# Patient Record
Sex: Female | Born: 1990
Health system: Southern US, Community
[De-identification: ages and names within clinical notes are randomized; demographics above are authoritative.]

## PROBLEM LIST (undated history)

## (undated) DIAGNOSIS — A749 Chlamydial infection, unspecified: Secondary | ICD-10-CM

## (undated) DIAGNOSIS — E559 Vitamin D deficiency, unspecified: Secondary | ICD-10-CM

## (undated) DIAGNOSIS — F909 Attention-deficit hyperactivity disorder, unspecified type: Secondary | ICD-10-CM

## (undated) HISTORY — DX: Chlamydial infection, unspecified: A74.9

## (undated) HISTORY — DX: Vitamin D deficiency, unspecified: E55.9

## (undated) HISTORY — DX: Attention-deficit hyperactivity disorder, unspecified type: F90.9

---
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
## 2009-03-15 ENCOUNTER — Ambulatory Visit: Payer: Self-pay | Admitting: Family Medicine

## 2010-01-18 ENCOUNTER — Ambulatory Visit: Payer: Self-pay | Admitting: Family Medicine

## 2010-07-25 ENCOUNTER — Inpatient Hospital Stay (HOSPITAL_COMMUNITY)
Admission: AD | Admit: 2010-07-25 | Discharge: 2010-07-25 | Disposition: A | Discharge status: Home / Self Care | Payer: BC Managed Care – PPO | Source: Ambulatory Visit | Attending: Obstetrics and Gynecology | Admitting: Obstetrics and Gynecology

## 2010-07-25 DIAGNOSIS — A5400 Gonococcal infection of lower genitourinary tract, unspecified: Secondary | ICD-10-CM | POA: Insufficient documentation

## 2010-07-25 DIAGNOSIS — N739 Female pelvic inflammatory disease, unspecified: Secondary | ICD-10-CM | POA: Insufficient documentation

## 2010-07-25 DIAGNOSIS — A5619 Other chlamydial genitourinary infection: Secondary | ICD-10-CM | POA: Insufficient documentation

## 2010-09-12 ENCOUNTER — Encounter: Payer: Self-pay | Admitting: Family Medicine

## 2011-01-02 ENCOUNTER — Encounter: Payer: BC Managed Care – PPO | Admitting: Family Medicine

## 2011-01-20 ENCOUNTER — Encounter: Payer: Self-pay | Admitting: Family Medicine

## 2011-01-20 ENCOUNTER — Ambulatory Visit (INDEPENDENT_AMBULATORY_CARE_PROVIDER_SITE_OTHER): Payer: BC Managed Care – PPO | Admitting: Family Medicine

## 2011-01-20 DIAGNOSIS — Z23 Encounter for immunization: Secondary | ICD-10-CM

## 2011-01-20 DIAGNOSIS — Z0000 Encounter for general adult medical examination without abnormal findings: Secondary | ICD-10-CM

## 2011-01-20 DIAGNOSIS — Z13220 Encounter for screening for lipoid disorders: Secondary | ICD-10-CM

## 2011-01-20 DIAGNOSIS — Z202 Contact with and (suspected) exposure to infections with a predominantly sexual mode of transmission: Secondary | ICD-10-CM

## 2011-01-20 LAB — POCT URINALYSIS DIPSTICK
Bilirubin, UA: NEGATIVE
Blood, UA: 5
Glucose, UA: NEGATIVE
Leukocytes, UA: NEGATIVE
Nitrite, UA: NEGATIVE
Urobilinogen, UA: NEGATIVE

## 2011-01-20 LAB — LIPID PANEL
Cholesterol: 188 mg/dL (ref 0–200)
Total CHOL/HDL Ratio: 3.8 Ratio

## 2011-01-20 LAB — HIV ANTIBODY (ROUTINE TESTING W REFLEX): HIV: NONREACTIVE

## 2011-01-20 LAB — RPR

## 2011-01-20 NOTE — Patient Instructions (Addendum)
HEALTH MAINTENANCE RECOMMENDATIONS:  It is recommended that you get at least 30 minutes of aerobic exercise at least 5 days/week (for weight loss, you may need as much as 60-90 minutes). This can be any activity that gets your heart rate up. This can be divided in 10-15 minute intervals if needed, but try and build up your endurance at least once a week.  Weight bearing exercise is also recommended twice weekly.  Eat a healthy diet with lots of vegetables, fruits and fiber.  "Colorful" foods have a lot of vitamins (ie green vegetables, tomatoes, red peppers, etc).  Limit sweet tea, regular sodas and alcoholic beverages, all of which has a lot of calories and sugar.  Up to 1 alcoholic drink daily may be beneficial for women (unless trying to lose weight, watch sugars).  Drink a lot of water.  Calcium recommendations are 1200-1500 mg daily (1500 mg for postmenopausal women or women without ovaries), and vitamin D 1000 IU daily.  This should be obtained from diet and/or supplements (vitamins), and calcium should not be taken all at once, but in divided doses.  Monthly self breast exams and yearly mammograms for women over the age of 21 is recommended.  Sunscreen of at least SPF 30 should be used on all sun-exposed parts of the skin when outside between the hours of 10 am and 4 pm (not just when at beach or pool, but even with exercise, golf, tennis, and yard work!)  Use a sunscreen that says "broad spectrum" so it covers both UVA and UVB rays, and make sure to reapply every 1-2 hours.  Remember to change the batteries in your smoke detectors when changing your clock times in the spring and fall.  Use your seat belt every time you are in a car, and please drive safely and not be distracted with cell phones and texting while driving.   Return in 2 months and 6 months for the 2nd and third gardisil (HPV) vaccines

## 2011-01-20 NOTE — Progress Notes (Signed)
Yvonne [name] is a 20 y.o. female who presents for a complete physical.  She has the following concerns: Occasionally has some spotting after intercourse--this usually occurs just after her cycle has ended, lasting only 1 day; then no further bleeding after intercourse the rest of the month.  She had gonorrhea in March.  Also states she had syphillis then--on further questioning, it sounds more like she had chlamydia (treated with a shot and 2 pills for treating both; didn't have any bloodwork taken).  Had test of cure also done by GYN.  Immunization History  Administered Date(s) Administered  . DTaP 12/18/1989, 03/05/1990, 05/14/1990, 02/03/1991  . Hepatitis B 01/31/2003, 03/13/2003, 08/07/2003  . HiB 12/18/1989, 03/05/1990, 05/14/1990, 02/03/1991  . IPV 12/18/1989, 03/05/1990, 02/03/1991  . Influenza Split 01/20/2011  . Influenza Whole 02/17/2007, 01/19/2008, 03/15/2009  . MMR 02/03/1991  Doesn't recall if/when she had TdaP (no record in chart) Last Pap smear: 06/2010 Last mammogram: never Last colonoscopy: never Last DEXA: never Dentist: twice yearly Ophtho: wears contacts, sees once yearly Exercise: 30 minutes daily  Past Medical History  Diagnosis Date  . ADHD (attention deficit hyperactivity disorder)     off meds since 2011    History reviewed. No pertinent past surgical history.  History   Social History  . Marital Status: Single    Spouse Name: N/A    Number of Children: N/A  . Years of Education: N/A   Occupational History  . student    Social History Main Topics  . Smoking status: Never Smoker   . Smokeless tobacco: Never Used  . Alcohol Use: Yes     1-2 drinks per weekend.  . Drug Use: No  . Sexually Active: Not Currently    Birth Control/ Protection: OCP   Other Topics Concern  . 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 Lives with her parents and brother    Family History  Problem Relation  Age of Onset  . Hypertension Mother   . Diabetes Father   . Hypertension Father     Current outpatient prescriptions:Norgestimate-Ethinyl Estradiol Triphasic (TRI-PREVIFEM) 0.18/0.215/0.25 MG-35 MCG tablet, Take 1 tablet by mouth daily.  , Disp: , Rfl:   No Known Allergies  ROS: The patient denies anorexia, fever, weight changes, headaches,  vision changes, decreased hearing, ear pain, sore throat, breast concerns, chest pain, palpitations, dizziness, syncope, dyspnea on exertion, cough, swelling, nausea, vomiting, diarrhea, constipation, abdominal pain, melena, hematochezia, indigestion/heartburn, hematuria, incontinence, dysuria, irregular menstrual cycles, vaginal discharge, odor or itch, genital lesions, joint pains, numbness, tingling, weakness, tremor, suspicious skin lesions, depression, anxiety, abnormal bleeding/bruising, or enlarged lymph nodes. +occasionally has some wheezing when she sleeps (wakes her up, rarely)--wakes up, sits up, and it completely resolves. Able to immediately lie back down and go to sleep with no problems  PHYSICAL EXAM: BP 102/70  Pulse 80  Ht 5' (1.524 m)  Wt 121 lb (54.885 kg)  BMI 23.63 kg/m2  LMP 01/15/2011  General Appearance:    Alert, cooperative, no distress, appears stated age  Head:    Normocephalic, without obvious abnormality, atraumatic  Eyes:    PERRL, conjunctiva/corneas clear, EOM's intact, fundi    benign  Ears:    Normal TM's and external ear canals  Nose:   Nares normal, mucosa normal, no drainage or sinus   tenderness  Throat:   Lips, mucosa, and tongue normal; teeth and gums normal  Neck:   Supple, no lymphadenopathy;  thyroid:  no   enlargement/tenderness/nodules; no carotid   bruit or JVD  Back:    Spine nontender, no curvature, ROM normal, no CVA     tenderness  Lungs:     Clear to auscultation bilaterally without wheezes, rales or     ronchi; respirations unlabored  Chest Wall:    No tenderness or deformity   Heart:     Regular rate and rhythm, S1 and S2 normal, no murmur, rub   or gallop  Breast Exam:    Deferred to GYN  Abdomen:     Soft, non-tender, nondistended, normoactive bowel sounds,    no masses, no hepatosplenomegaly  Genitalia:    Deferred to GYN     Extremities:   No clubbing, cyanosis or edema  Pulses:   2+ and symmetric all extremities  Skin:   Skin color, texture, turgor normal, no rashes or lesions.  Hypopigmented macule upper back (round), and mid back--a littlemore irregular, consistent with birthmark  Lymph nodes:   Cervical, supraclavicular, and axillary nodes normal  Neurologic:   CNII-XII intact, normal strength, sensation and gait; reflexes 2+ and symmetric throughout          Psych:   Normal mood, affect, hygiene and grooming.    ASSESSMENT/PLAN:  1. Routine general medical examination at a health care facility  Visual acuity screening, POCT Urinalysis Dipstick  2. Need for prophylactic vaccination and inoculation against influenza  Flu vaccine greater than or equal to 3yo preservative free IM  3. Exposure to STD  HIV Antibody ( Reflex), RPR  4. Screening for lipoid disorders  Lipid panel  5. Need for Tdap vaccination  Tdap vaccine greater than or equal to 7yo IM  6. Need for HPV vaccination  HPV vaccine quadrivalent 3 dose IM   H/o STD's in March.  Never had HIV or RPR.   Check today  Continue self breast exams; at least 30 minutes of aerobic activity at least 5 days/week; proper sunscreen use reviewed; healthy diet, including goals of calcium and vitamin D intake and alcohol recommendations (less than or equal to 1 drink/day) reviewed; regular seatbelt use; changing batteries in smoke detectors.  Immunization recommendations discussed--updated today.

## 2011-01-21 ENCOUNTER — Telehealth: Payer: Self-pay | Admitting: *Deleted

## 2011-01-21 NOTE — Telephone Encounter (Signed)
Left message for patient to return my call to go over lab results. 

## 2011-01-27 ENCOUNTER — Telehealth: Payer: Self-pay | Admitting: *Deleted

## 2011-01-27 NOTE — Telephone Encounter (Signed)
Spoke with patient and informed her that her lipids were good, no HIV or syphilis.

## 2011-03-24 ENCOUNTER — Other Ambulatory Visit (INDEPENDENT_AMBULATORY_CARE_PROVIDER_SITE_OTHER): Payer: BC Managed Care – PPO

## 2011-03-24 DIAGNOSIS — Z23 Encounter for immunization: Secondary | ICD-10-CM

## 2011-06-27 LAB — HM PAP SMEAR

## 2011-07-21 ENCOUNTER — Other Ambulatory Visit: Payer: BC Managed Care – PPO

## 2011-07-22 ENCOUNTER — Other Ambulatory Visit: Payer: BC Managed Care – PPO

## 2011-07-28 ENCOUNTER — Other Ambulatory Visit (INDEPENDENT_AMBULATORY_CARE_PROVIDER_SITE_OTHER): Payer: 59

## 2011-07-28 DIAGNOSIS — Z23 Encounter for immunization: Secondary | ICD-10-CM

## 2012-02-27 DIAGNOSIS — A749 Chlamydial infection, unspecified: Secondary | ICD-10-CM

## 2012-02-27 HISTORY — DX: Chlamydial infection, unspecified: A74.9

## 2012-03-29 ENCOUNTER — Ambulatory Visit (INDEPENDENT_AMBULATORY_CARE_PROVIDER_SITE_OTHER): Payer: 59 | Admitting: Family Medicine

## 2012-03-29 ENCOUNTER — Encounter: Payer: Self-pay | Admitting: Family Medicine

## 2012-03-29 VITALS — BP 110/64 | HR 72 | Ht 61.0 in | Wt 118.0 lb

## 2012-03-29 DIAGNOSIS — Z111 Encounter for screening for respiratory tuberculosis: Secondary | ICD-10-CM

## 2012-03-29 DIAGNOSIS — Z0000 Encounter for general adult medical examination without abnormal findings: Secondary | ICD-10-CM

## 2012-03-29 DIAGNOSIS — Z23 Encounter for immunization: Secondary | ICD-10-CM

## 2012-03-29 LAB — POCT URINALYSIS DIPSTICK
Bilirubin, UA: NEGATIVE
Blood, UA: NEGATIVE
Nitrite, UA: NEGATIVE
Protein, UA: NEGATIVE
pH, UA: 5

## 2012-03-29 NOTE — Progress Notes (Signed)
Chief Complaint  Patient presents with  . Annual Exam    nonfasting annual exam no pap-sees Dr. Ambrose Mantle and is up to date. Would like a flu shot today. Thinks maybe she has a yeast infection, has white discharge, itch and odor. Also would like to know if douching is okay.   Yvonne [name] is a 21 y.o. female who presents for a complete physical.  She has the following concerns:  Needs PPD for school  Complaining of vaginal discharge--it is clearish-white, sometimes is just white.  Only occasionally has itching.  There is a slight odor to her underwear, and she isn't really sure if it is the discharge that has the odor, versus just her private parts, related to sweating.  She reports having a lot of sweating in her private area, and underwear is often moist from perspiration (not just from discharge).  She wears both cotton and nylon underwear.  Denies there being any change to the discharge, has remained the same x months.  [facility] and HIV test.  She had chlamydia and was treated for it.  She is scheduled to follow up with them for recheck (3 month f/u, per pt).  She was also treated for STD last year by her GYN.  She is not currently in a sexual relationship (since she broke up with boyfriend after learning of his infidelity).  Immunization History  Administered Date(s) Administered  . DTaP 12/18/1989, 03/05/1990, 05/14/1990, 02/03/1991  . HPV Quadrivalent 01/20/2011, 03/24/2011, 07/28/2011  . Hepatitis B 01/31/2003, 03/13/2003, 08/07/2003  . HiB 12/18/1989, 03/05/1990, 05/14/1990, 02/03/1991  . IPV 12/18/1989, 03/05/1990, 02/03/1991  . Influenza Split 01/20/2011  . Influenza Whole 02/17/2007, 01/19/2008, 03/15/2009  . MMR 02/03/1991  . Tdap 01/20/2011   Last Pap smear: March, 2013 with Dr. Ambrose Mantle Last mammogram: n/a Last colonoscopy:n/a Last DEXA: n/a Dentist: twice yearly Ophtho: yearly, wears contacts Exercise: only about 10  mins/day--walking to a friend's house, or to the store.  Past Medical History  Diagnosis Date  . ADHD (attention deficit hyperactivity disorder)     off meds since 2011  . Chlamydia 02/2012    treated at [facility]    History reviewed. No pertinent past surgical history.  History   Social History  . Marital Status: Single    Spouse Name: N/A    Number of Children: N/A  . Years of Education: N/A   Occupational History  . Not on file.   Social History Main Topics  . Smoking status: Never Smoker   . Smokeless tobacco: Never Used  . Alcohol Use: Yes     Comment: 1-2 drinks per weekend.  . Drug Use: No  . Sexually Active: Not Currently -- Female partner(s)    Birth Control/ Protection: OCP   Other Topics Concern  . 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 Lives with her parents and brother and is looking for a job.    Family History  Problem Relation Age of Onset  . Hypertension Mother   . Diabetes Father   . Hypertension Father   . Cancer Paternal Aunt     breast cancer    Current outpatient prescriptions:Norgestimate-Ethinyl Estradiol Triphasic (TRI-PREVIFEM) 0.18/0.215/0.25 MG-35 MCG tablet, Take 1 tablet by mouth daily.  , Disp: , Rfl:   No Known Allergies  ROS: The patient denies anorexia, fever, weight changes,  headaches, vision changes, decreased hearing, ear pain, sore throat, breast concerns, chest pain, palpitations, dizziness, syncope, dyspnea on exertion, cough, swelling, nausea, vomiting, diarrhea, constipation, abdominal pain, melena, hematochezia, indigestion/heartburn, hematuria, incontinence, dysuria, irregular menstrual cycles, genital lesions, joint pains, numbness, tingling, weakness, tremor, suspicious skin lesions, depression, anxiety, abnormal bleeding/bruising, or enlarged lymph nodes.  +vaginal discharge/odor as per HPI  PHYSICAL  EXAM: BP 110/64  Pulse 72  Ht 5\' 1"  (1.549 m)  Wt 118 lb (53.524 kg)  BMI 22.30 kg/m2  LMP 03/03/2012  General Appearance:  Alert, cooperative, no distress, appears stated age   Head:  Normocephalic, without obvious abnormality, atraumatic   Eyes:  PERRL, conjunctiva/corneas clear, EOM's intact, fundi  benign   Ears:  Normal TM's and external ear canals   Nose:  Nares normal, mucosa normal, no drainage or sinus tenderness   Throat:  Lips, mucosa, and tongue normal; teeth and gums normal   Neck:  Supple, no lymphadenopathy; thyroid: no enlargement/tenderness/nodules; no carotid  bruit or JVD   Back:  Spine nontender, no curvature, ROM normal, no CVA tenderness   Lungs:  Clear to auscultation bilaterally without wheezes, rales or ronchi; respirations unlabored   Chest Wall:  No tenderness or deformity   Heart:  Regular rate and rhythm, S1 and S2 normal, no murmur, rub  or gallop   Breast Exam:  Deferred to GYN   Abdomen:  Soft, non-tender, nondistended, normoactive bowel sounds,  no masses, no hepatosplenomegaly   Genitalia:  Deferred to GYN      Extremities:  No clubbing, cyanosis or edema   Pulses:  2+ and symmetric all extremities   Skin:  Skin color, texture, turgor normal, no rashes or lesions. Hypopigmented macule upper back (round), and mid back--a littlemore irregular, consistent with birthmark   Lymph nodes:  Cervical, supraclavicular, and axillary nodes normal   Neurologic:  CNII-XII intact, normal strength, sensation and gait; reflexes 2+ and symmetric throughout   Psych: Normal mood, affect, hygiene and grooming.   ASSESSMENT/PLAN: 1. Routine general medical examination at a health care facility  Visual acuity screening, POCT Urinalysis Dipstick  2. Need for prophylactic vaccination and inoculation against influenza  Flu vaccine greater than or equal to 3yo preservative free IM  3. Screening examination for pulmonary tuberculosis  TB Skin Test   ?vaginal discharge,  odor. Sounds as though discharge is physiologic, unchanged.  Odor sounds more like body odor, rather than an odorous discharge.  Explained at length, all questions answered. Use cotton underwear, consider powder to help with moisture, and consider pantiliners, that can be changed out for dry ones more easily than changing underwear.  History doesn't sound consistent with either BV or yeast infection at this time, but some normal discharge, and some body odor in vaginal area related to perspiration (not necessary an odorous vaginal discharge).  SAFE SEX was discussed at length.  She has had two STD's in the last year or so, and 9 sexual partners.  Discussed at length.  Encouraged regular condom use in addition to the OCP's she takes.  Discussed monthly self breast exam; at least 30 minutes of aerobic activity at least 5 days/week; proper sunscreen use reviewed; healthy diet, including goals of calcium and vitamin D intake and alcohol recommendations (less than or equal to 1 drink/day) reviewed; regular seatbelt use; changing batteries in smoke detectors.  Immunization recommendations discussed--flu shot today  Return for PPD reading in 2-3 days. Lipids normal last year Had recent HIV test through [facility], not repeated  today. F/u with [facility] as scheduled for TOC

## 2012-03-29 NOTE — Patient Instructions (Signed)
HEALTH MAINTENANCE RECOMMENDATIONS:  It is recommended that you get at least 30 minutes of aerobic exercise at least 5 days/week (for weight loss, you may need as much as 60-90 minutes). This can be any activity that gets your heart rate up. This can be divided in 10-15 minute intervals if needed, but try and build up your endurance at least once a week.  Weight bearing exercise is also recommended twice weekly.  Eat a healthy diet with lots of vegetables, fruits and fiber.  "Colorful" foods have a lot of vitamins (ie green vegetables, tomatoes, red peppers, etc).  Limit sweet tea, regular sodas and alcoholic beverages, all of which has a lot of calories and sugar.  Up to 1 alcoholic drink daily may be beneficial for women (unless trying to lose weight, watch sugars).  Drink a lot of water.  Calcium recommendations are 1200-1500 mg daily (1500 mg for postmenopausal women or women without ovaries), and vitamin D 1000 IU daily.  This should be obtained from diet and/or supplements (vitamins), and calcium should not be taken all at once, but in divided doses.  Monthly self breast exams and yearly mammograms for women over the age of 80 is recommended.  Sunscreen of at least SPF 30 should be used on all sun-exposed parts of the skin when outside between the hours of 10 am and 4 pm (not just when at beach or pool, but even with exercise, golf, tennis, and yard work!)  Use a sunscreen that says "broad spectrum" so it covers both UVA and UVB rays, and make sure to reapply every 1-2 hours.  Remember to change the batteries in your smoke detectors when changing your clock times in the spring and fall.  Use your seat belt every time you are in a car, and please drive safely and not be distracted with cell phones and texting while driving.  Please use condoms regularly to avoid getting sexually transmitted diseases.  Birth control pills only protect against pregnancy, not these other infections.  [facility] for [facility] reche[facility] to ensure that infection is completely gone.

## 2012-03-31 LAB — TB SKIN TEST

## 2012-09-06 ENCOUNTER — Encounter: Payer: Self-pay | Admitting: Family Medicine

## 2015-01-30 ENCOUNTER — Other Ambulatory Visit (INDEPENDENT_AMBULATORY_CARE_PROVIDER_SITE_OTHER): Payer: 59

## 2015-01-30 DIAGNOSIS — Z23 Encounter for immunization: Secondary | ICD-10-CM

## 2015-04-02 ENCOUNTER — Telehealth: Payer: Self-pay | Admitting: *Deleted

## 2015-04-02 ENCOUNTER — Encounter: Payer: 59 | Admitting: Family Medicine

## 2015-04-02 DIAGNOSIS — Z0000 Encounter for general adult medical examination without abnormal findings: Secondary | ICD-10-CM

## 2015-04-02 NOTE — Telephone Encounter (Signed)
Hasn't been seen in 3 years, this was for physical. She should get letter and be charged no show fee.

## 2015-04-02 NOTE — Telephone Encounter (Signed)
This patient no showed for their appointment today.Which of the following is necessary for this patient.   A) No follow-up necessary   B) Follow-up urgent. Locate Patient Immediately.   C) Follow-up necessary. Contact patient and Schedule visit in ____ Days.   D) Follow-up Advised. Contact patient and Schedule visit in ____ Days. 

## 2015-04-06 ENCOUNTER — Encounter: Payer: Self-pay | Admitting: Family Medicine

## 2015-04-06 NOTE — Telephone Encounter (Signed)
No show letter with fee sent °

## 2015-06-28 ENCOUNTER — Encounter: Payer: Self-pay | Admitting: Family Medicine

## 2015-06-28 ENCOUNTER — Ambulatory Visit (INDEPENDENT_AMBULATORY_CARE_PROVIDER_SITE_OTHER): Payer: 59 | Admitting: Family Medicine

## 2015-06-28 VITALS — BP 108/62 | HR 68 | Ht 60.5 in | Wt 128.0 lb

## 2015-06-28 DIAGNOSIS — R21 Rash and other nonspecific skin eruption: Secondary | ICD-10-CM

## 2015-06-28 DIAGNOSIS — Z0000 Encounter for general adult medical examination without abnormal findings: Secondary | ICD-10-CM

## 2015-06-28 DIAGNOSIS — R5383 Other fatigue: Secondary | ICD-10-CM | POA: Diagnosis not present

## 2015-06-28 LAB — HIV ANTIBODY (ROUTINE TESTING W REFLEX): HIV: NONREACTIVE

## 2015-06-28 LAB — POCT URINALYSIS DIPSTICK
Bilirubin, UA: NEGATIVE
Glucose, UA: NEGATIVE
Ketones, UA: NEGATIVE
LEUKOCYTES UA: NEGATIVE
NITRITE UA: NEGATIVE
PH UA: 6
PROTEIN UA: NEGATIVE
Spec Grav, UA: 1.02
UROBILINOGEN UA: NEGATIVE
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
## 2015-06-28 NOTE — Patient Instructions (Addendum)
  HEALTH MAINTENANCE RECOMMENDATIONS:  It is recommended that you get at least 30 minutes of aerobic exercise at least 5 days/week (for weight loss, you may need as much as 60-90 minutes). This can be any activity that gets your heart rate up. This can be divided in 10-15 minute intervals if needed, but try and build up your endurance at least once a week.  Weight bearing exercise is also recommended twice weekly.  Eat a healthy diet with lots of vegetables, fruits and fiber.  "Colorful" foods have a lot of vitamins (ie green vegetables, tomatoes, red peppers, etc).  Limit sweet tea, regular sodas and alcoholic beverages, all of which has a lot of calories and sugar.  Up to 1 alcoholic drink daily may be beneficial for women (unless trying to lose weight, watch sugars).  Drink a lot of water.  Calcium recommendations are 1200-1500 mg daily (1500 mg for postmenopausal women or women without ovaries), and vitamin D 1000 IU daily.  This should be obtained from diet and/or supplements (vitamins), and calcium should not be taken all at once, but in divided doses.  Monthly self breast exams and yearly mammograms for women over the age of 22 is recommended.  Sunscreen of at least SPF 30 should be used on all sun-exposed parts of the skin when outside between the hours of 10 am and 4 pm (not just when at beach or pool, but even with exercise, golf, tennis, and yard work!)  Use a sunscreen that says "broad spectrum" so it covers both UVA and UVB rays, and make sure to reapply every 1-2 hours.  Remember to change the batteries in your smoke detectors when changing your clock times in the spring and fall.  Use your seat belt every time you are in a car, and please drive safely and not be distracted with cell phones and texting while driving.   It sounds as though you may not be getting enough calcium in your diet.  Consider trying to drink more milk, eat yogurt more regularly, or take a supplement (Calcium+D  pill, vs Viactiv chew, vs Tums as calcium and a separate vitamin D or a multivitamin daily).  We are checking your vitamin D level today, and will let you know if it is low.  If it is very low, we treat with prescription, followed by you taking a vitamin daily, long-term.  Be sure to always use condoms when having sexual relations. We are checking HIV and syphillis tests today.  Please drink plenty of water, moisturize your skin well, and avoid prolonged hot showers.  Return if rash is worsening or changing.

## 2015-06-29 ENCOUNTER — Encounter: Payer: Self-pay | Admitting: Family Medicine

## 2015-06-29 DIAGNOSIS — E559 Vitamin D deficiency, unspecified: Secondary | ICD-10-CM | POA: Insufficient documentation

## 2015-06-29 LAB — VITAMIN D 25 HYDROXY (VIT D DEFICIENCY, FRACTURES): Vit D, 25-Hydroxy: 8 ng/mL — ABNORMAL LOW (ref 30–100)

## 2015-06-29 LAB — RPR

## 2015-07-05 ENCOUNTER — Encounter: Payer: Self-pay | Admitting: *Deleted

## 2015-07-05 ENCOUNTER — Other Ambulatory Visit: Payer: Self-pay | Admitting: *Deleted

## 2015-07-05 MED ORDER — ERGOCALCIFEROL 1.25 MG (50000 UT) PO CAPS: 50000.0000 [IU] | ORAL_CAPSULE | ORAL | Status: DC

## 2015-09-13 ENCOUNTER — Other Ambulatory Visit: Payer: Self-pay | Admitting: Family Medicine

## 2016-01-14 ENCOUNTER — Encounter: Payer: Self-pay | Admitting: Internal Medicine

## 2016-01-14 ENCOUNTER — Ambulatory Visit (INDEPENDENT_AMBULATORY_CARE_PROVIDER_SITE_OTHER): Payer: Self-pay | Admitting: Internal Medicine

## 2016-01-14 VITALS — BP 111/59 | HR 62 | Temp 98.3°F | Ht 60.5 in | Wt 128.0 lb

## 2016-01-14 DIAGNOSIS — Z7251 High risk heterosexual behavior: Secondary | ICD-10-CM | POA: Insufficient documentation

## 2016-01-14 DIAGNOSIS — E559 Vitamin D deficiency, unspecified: Secondary | ICD-10-CM

## 2016-01-14 DIAGNOSIS — Z0000 Encounter for general adult medical examination without abnormal findings: Secondary | ICD-10-CM | POA: Insufficient documentation

## 2016-01-14 NOTE — Assessment & Plan Note (Signed)
Patient finished 12 week course of weekly Vit D therapy as her Vit D was 8  Plan -repeat Vit D level

## 2016-01-14 NOTE — Patient Instructions (Signed)
Thank you for your visit today  Please follow up in 6 months

## 2016-01-14 NOTE — Assessment & Plan Note (Signed)
Plan Declined flu shot

## 2016-01-14 NOTE — Progress Notes (Signed)
    CC: establish here HPI: [name] is a 25 y.o. woman with PMH noted below including vit d deficiency came here to establish here and also to obtain repeat vit D level after completing 12 week course   Please see Problem List/A&P for the status of the patient's chronic medical problems  PMH: vitamin D deficiency  FH: Diabetes and HTN on both sides, leukemia in her father at age 26  SH: Does not smoke, no heavy alcohol use, no illicit drug use. Works in [employer] overnight    Past Medical History:  Diagnosis Date  . ADHD (attention deficit hyperactivity disorder)    off meds since 2011  . Chlamydia 02/2012   [facility]    Review of Systems:  Constitutional: Negative for fever, chills, weight loss and malaise/fatigue.  HEENT: No headaches, vision problems, cough, hearing problems  Respiratory: Negative for cough, shortness of breath and wheezing.  Gastrointestinal: Negative for heartburn, nausea, vomiting, abdominal pain, diarrhea  Musculoskeletal: Negative for myalgias, joint pain,   Physical Exam: Vitals:   01/14/16 1631  BP: (!) 111/59  Pulse: 62  Temp: 98.3 F (36.8 C)  TempSrc: Oral  SpO2: 100%  Weight: 128 lb (58.1 kg)  Height: 5' 0.5" (1.537 m)    General: A&O, in NAD HEENT: EOMI, PERRLA Neck: supple, midline trachea, no cervical lymphadenopathy  CV: RRR, normal s1, s2, no m/r/g,  Resp: equal and symmetric breath sounds, no wheezing or crackles  Abdomen: soft, nontender, nondistended, +BS Extremities: no edema, clubbing     Assessment & Plan:   See encounters tab for problem based medical decision making. Patient discussed with Dr. Criselda PeachesMullen

## 2016-01-14 NOTE — Assessment & Plan Note (Signed)
Patient is sexually active with one female partner and uses protection occasionally. She wanted to be tested for HIV  Patient was present with a friend in the room, and patient was ok with her being present for this discussion.    Plan -Ordered HIV screening test

## 2016-01-15 ENCOUNTER — Telehealth: Payer: Self-pay | Admitting: Internal Medicine

## 2016-01-15 LAB — VITAMIN D 25 HYDROXY (VIT D DEFICIENCY, FRACTURES): Vit D, 25-Hydroxy: 18.3 ng/mL — ABNORMAL LOW (ref 30.0–100.0)

## 2016-01-15 LAB — HIV ANTIBODY (ROUTINE TESTING W REFLEX): HIV Screen 4th Generation wRfx: NONREACTIVE

## 2016-01-15 NOTE — Telephone Encounter (Signed)
Called Ms. Weygandt to give results of her blood work.  I confirmed identity with birthdate.   Vitamin D still low at 18 - recommended OTC 1000 IU daily and recheck at next visit.   HIV negative.   She had no questions.   Debe CoderMULLEN, Kyrian Stage, MD

## 2016-01-18 ENCOUNTER — Ambulatory Visit: Payer: Self-pay

## 2016-01-22 NOTE — Progress Notes (Signed)
Internal Medicine Clinic Attending  Case discussed with Dr. Saraiya at the time of the visit.  We reviewed the resident's history and exam and pertinent patient test results.  I agree with the assessment, diagnosis, and plan of care documented in the resident's note.  

## 2016-01-24 ENCOUNTER — Telehealth: Payer: Self-pay | Admitting: Internal Medicine

## 2016-01-24 NOTE — Telephone Encounter (Signed)
APT. REMINDER CALL, LMTCB °

## 2016-01-25 ENCOUNTER — Ambulatory Visit: Payer: Self-pay

## 2016-01-28 ENCOUNTER — Ambulatory Visit: Payer: Self-pay

## 2016-01-29 ENCOUNTER — Ambulatory Visit: Payer: Self-pay

## 2016-02-05 ENCOUNTER — Ambulatory Visit: Payer: Self-pay

## 2016-03-11 ENCOUNTER — Telehealth: Payer: Self-pay

## 2016-03-11 NOTE — Telephone Encounter (Signed)
Needs to speak with a nurse regarding birth control pill.

## 2016-03-18 NOTE — Telephone Encounter (Signed)
appt made for eval for BCP

## 2016-03-25 ENCOUNTER — Ambulatory Visit: Payer: Self-pay

## 2016-03-28 ENCOUNTER — Encounter: Payer: Self-pay | Admitting: Internal Medicine

## 2016-03-31 ENCOUNTER — Telehealth: Payer: Self-pay | Admitting: Internal Medicine

## 2016-03-31 NOTE — Telephone Encounter (Signed)
CALLED PT, LMTCB, TIME TO RENEW GCCN, BUT NEED TO DO APP WITH [facility] FIRST

## 2016-05-05 ENCOUNTER — Encounter: Payer: Self-pay | Admitting: Internal Medicine

## 2016-05-07 DIAGNOSIS — Z3201 Encounter for pregnancy test, result positive: Secondary | ICD-10-CM | POA: Diagnosis not present

## 2016-05-07 DIAGNOSIS — Z130 Encounter for screening for diseases of the blood and blood-forming organs and certain disorders involving the immune mechanism: Secondary | ICD-10-CM | POA: Diagnosis not present

## 2016-05-07 DIAGNOSIS — Z202 Contact with and (suspected) exposure to infections with a predominantly sexual mode of transmission: Secondary | ICD-10-CM | POA: Diagnosis not present

## 2016-05-07 DIAGNOSIS — Z01419 Encounter for gynecological examination (general) (routine) without abnormal findings: Secondary | ICD-10-CM | POA: Diagnosis not present

## 2016-05-07 DIAGNOSIS — Z1389 Encounter for screening for other disorder: Secondary | ICD-10-CM | POA: Diagnosis not present

## 2016-05-07 DIAGNOSIS — Z3200 Encounter for pregnancy test, result unknown: Secondary | ICD-10-CM | POA: Diagnosis not present

## 2016-05-07 DIAGNOSIS — Z113 Encounter for screening for infections with a predominantly sexual mode of transmission: Secondary | ICD-10-CM | POA: Diagnosis not present

## 2016-05-21 DIAGNOSIS — Z23 Encounter for immunization: Secondary | ICD-10-CM | POA: Diagnosis not present

## 2016-05-21 DIAGNOSIS — N911 Secondary amenorrhea: Secondary | ICD-10-CM | POA: Diagnosis not present

## 2016-05-21 DIAGNOSIS — Z3A01 Less than 8 weeks gestation of pregnancy: Secondary | ICD-10-CM | POA: Diagnosis not present

## 2016-05-21 DIAGNOSIS — O26891 Other specified pregnancy related conditions, first trimester: Secondary | ICD-10-CM | POA: Diagnosis not present

## 2016-06-02 ENCOUNTER — Encounter: Payer: Self-pay | Admitting: Internal Medicine

## 2016-06-05 DIAGNOSIS — Z368A Encounter for antenatal screening for other genetic defects: Secondary | ICD-10-CM | POA: Diagnosis not present

## 2016-06-05 DIAGNOSIS — Z113 Encounter for screening for infections with a predominantly sexual mode of transmission: Secondary | ICD-10-CM | POA: Diagnosis not present

## 2016-06-05 DIAGNOSIS — Z3A09 9 weeks gestation of pregnancy: Secondary | ICD-10-CM | POA: Diagnosis not present

## 2016-06-05 DIAGNOSIS — Z3689 Encounter for other specified antenatal screening: Secondary | ICD-10-CM | POA: Diagnosis not present

## 2016-06-05 DIAGNOSIS — O26891 Other specified pregnancy related conditions, first trimester: Secondary | ICD-10-CM | POA: Diagnosis not present

## 2016-06-05 DIAGNOSIS — Z3401 Encounter for supervision of normal first pregnancy, first trimester: Secondary | ICD-10-CM | POA: Diagnosis not present

## 2016-06-05 LAB — OB RESULTS CONSOLE RPR: RPR: NONREACTIVE

## 2016-06-05 LAB — OB RESULTS CONSOLE RUBELLA ANTIBODY, IGM: RUBELLA: IMMUNE

## 2016-06-05 LAB — OB RESULTS CONSOLE HEPATITIS B SURFACE ANTIGEN
HEP B S AG: POSITIVE
HEP B S AG: NEGATIVE
HEP B S AG: NEGATIVE
HEP B S AG: NEGATIVE
HEP B S AG: NEGATIVE

## 2016-06-05 LAB — OB RESULTS CONSOLE ABO/RH: RH TYPE: POSITIVE

## 2016-06-05 LAB — OB RESULTS CONSOLE GC/CHLAMYDIA
Chlamydia: NEGATIVE
Gonorrhea: NEGATIVE

## 2016-06-05 LAB — OB RESULTS CONSOLE HIV ANTIBODY (ROUTINE TESTING): HIV: NONREACTIVE

## 2016-06-05 LAB — OB RESULTS CONSOLE ANTIBODY SCREEN: ANTIBODY SCREEN: NEGATIVE

## 2016-06-23 DIAGNOSIS — Z3A12 12 weeks gestation of pregnancy: Secondary | ICD-10-CM | POA: Diagnosis not present

## 2016-06-23 DIAGNOSIS — Z3682 Encounter for antenatal screening for nuchal translucency: Secondary | ICD-10-CM | POA: Diagnosis not present

## 2016-07-14 DIAGNOSIS — H40013 Open angle with borderline findings, low risk, bilateral: Secondary | ICD-10-CM | POA: Diagnosis not present

## 2016-07-24 DIAGNOSIS — H40003 Preglaucoma, unspecified, bilateral: Secondary | ICD-10-CM | POA: Diagnosis not present

## 2016-08-04 DIAGNOSIS — Z363 Encounter for antenatal screening for malformations: Secondary | ICD-10-CM | POA: Diagnosis not present

## 2016-08-04 DIAGNOSIS — Z3A18 18 weeks gestation of pregnancy: Secondary | ICD-10-CM | POA: Diagnosis not present

## 2016-10-15 DIAGNOSIS — Z3A28 28 weeks gestation of pregnancy: Secondary | ICD-10-CM | POA: Diagnosis not present

## 2016-10-15 DIAGNOSIS — Z23 Encounter for immunization: Secondary | ICD-10-CM | POA: Diagnosis not present

## 2016-10-15 DIAGNOSIS — Z3689 Encounter for other specified antenatal screening: Secondary | ICD-10-CM | POA: Diagnosis not present

## 2016-12-04 DIAGNOSIS — Z3685 Encounter for antenatal screening for Streptococcus B: Secondary | ICD-10-CM | POA: Diagnosis not present

## 2016-12-04 LAB — OB RESULTS CONSOLE GBS: GBS: POSITIVE

## 2016-12-29 ENCOUNTER — Inpatient Hospital Stay (HOSPITAL_COMMUNITY)
Admission: AD | Admit: 2016-12-29 | Discharge: 2016-12-29 | Disposition: A | Discharge status: Home / Self Care | Payer: Medicaid Other | Source: Ambulatory Visit | Attending: Obstetrics and Gynecology | Admitting: Obstetrics and Gynecology

## 2016-12-29 ENCOUNTER — Encounter (HOSPITAL_COMMUNITY)
Admission: AD | Disposition: A | Discharge status: Home / Self Care | Payer: Self-pay | Source: Ambulatory Visit | Attending: Obstetrics and Gynecology

## 2016-12-29 ENCOUNTER — Inpatient Hospital Stay (HOSPITAL_COMMUNITY)
Admission: AD | Admit: 2016-12-29 | Discharge: 2017-01-01 | DRG: 766 | Disposition: A | Discharge status: Home / Self Care | Payer: Medicaid Other | Source: Ambulatory Visit | Attending: Obstetrics and Gynecology | Admitting: Obstetrics and Gynecology

## 2016-12-29 ENCOUNTER — Inpatient Hospital Stay (HOSPITAL_COMMUNITY): Payer: Medicaid Other | Admitting: Anesthesiology

## 2016-12-29 ENCOUNTER — Encounter (HOSPITAL_COMMUNITY): Payer: Self-pay

## 2016-12-29 ENCOUNTER — Encounter (HOSPITAL_COMMUNITY): Payer: Self-pay | Admitting: *Deleted

## 2016-12-29 DIAGNOSIS — Z98891 History of uterine scar from previous surgery: Secondary | ICD-10-CM

## 2016-12-29 DIAGNOSIS — O99824 Streptococcus B carrier state complicating childbirth: Secondary | ICD-10-CM | POA: Diagnosis present

## 2016-12-29 DIAGNOSIS — Z3A39 39 weeks gestation of pregnancy: Secondary | ICD-10-CM | POA: Diagnosis not present

## 2016-12-29 DIAGNOSIS — O26893 Other specified pregnancy related conditions, third trimester: Secondary | ICD-10-CM | POA: Diagnosis present

## 2016-12-29 DIAGNOSIS — O479 False labor, unspecified: Secondary | ICD-10-CM

## 2016-12-29 LAB — CBC
HCT: 36.3 % (ref 36.0–46.0)
HEMOGLOBIN: 11.8 g/dL — AB (ref 12.0–15.0)
MCH: 26 pg (ref 26.0–34.0)
MCHC: 32.5 g/dL (ref 30.0–36.0)
MCV: 80 fL (ref 78.0–100.0)
Platelets: 158 10*3/uL (ref 150–400)
RBC: 4.54 MIL/uL (ref 3.87–5.11)
RDW: 14.1 % (ref 11.5–15.5)
WBC: 15.7 10*3/uL — ABNORMAL HIGH (ref 4.0–10.5)

## 2016-12-29 LAB — TYPE AND SCREEN
ABO/RH(D): A POS
Antibody Screen: NEGATIVE

## 2016-12-29 SURGERY — Surgical Case
Anesthesia: Spinal | Site: Abdomen | Wound class: Clean Contaminated

## 2016-12-29 MED ORDER — OXYTOCIN 10 UNIT/ML IJ SOLN
INTRAMUSCULAR | Status: AC
  Filled 2016-12-29: qty 4

## 2016-12-29 MED ORDER — ZOLPIDEM TARTRATE 5 MG PO TABS: 5.0000 mg | ORAL_TABLET | Freq: Every evening | ORAL | Status: DC | PRN

## 2016-12-29 MED ORDER — LACTATED RINGERS IV SOLN
INTRAVENOUS | Status: DC | PRN
  Administered 2016-12-29 (×3): via INTRAVENOUS

## 2016-12-29 MED ORDER — MORPHINE SULFATE (PF) 0.5 MG/ML IJ SOLN
INTRAMUSCULAR | Status: DC | PRN
  Administered 2016-12-29: .2 mg via INTRATHECAL

## 2016-12-29 MED ORDER — MEPERIDINE HCL 25 MG/ML IJ SOLN
INTRAMUSCULAR | Status: AC
  Filled 2016-12-29: qty 1

## 2016-12-29 MED ORDER — OXYCODONE HCL 5 MG PO TABS: 5.0000 mg | ORAL_TABLET | ORAL | Status: DC | PRN

## 2016-12-29 MED ORDER — OXYTOCIN 40 UNITS IN LACTATED RINGERS INFUSION - SIMPLE MED: 2.5000 [IU]/h | INTRAVENOUS | Status: AC

## 2016-12-29 MED ORDER — PRENATAL MULTIVITAMIN CH
1.0000 | ORAL_TABLET | Freq: Every day | ORAL | Status: DC
  Administered 2016-12-30 – 2017-01-01 (×3): 1 via ORAL
  Filled 2016-12-29 (×3): qty 1

## 2016-12-29 MED ORDER — MENTHOL 3 MG MT LOZG: 1.0000 | LOZENGE | OROMUCOSAL | Status: DC | PRN

## 2016-12-29 MED ORDER — BUPIVACAINE IN DEXTROSE 0.75-8.25 % IT SOLN
INTRATHECAL | Status: AC
  Filled 2016-12-29: qty 2

## 2016-12-29 MED ORDER — MEPERIDINE HCL 25 MG/ML IJ SOLN
INTRAMUSCULAR | Status: DC | PRN
  Administered 2016-12-29: 12.5 mg via INTRAVENOUS

## 2016-12-29 MED ORDER — COCONUT OIL OIL: 1.0000 "application " | TOPICAL_OIL | Status: DC | PRN

## 2016-12-29 MED ORDER — IBUPROFEN 600 MG PO TABS
600.0000 mg | ORAL_TABLET | Freq: Four times a day (QID) | ORAL | Status: DC
  Administered 2016-12-29 – 2017-01-01 (×11): 600 mg via ORAL
  Filled 2016-12-29 (×11): qty 1

## 2016-12-29 MED ORDER — SIMETHICONE 80 MG PO CHEW
80.0000 mg | CHEWABLE_TABLET | ORAL | Status: DC
  Administered 2016-12-29 – 2016-12-31 (×3): 80 mg via ORAL
  Filled 2016-12-29 (×3): qty 1

## 2016-12-29 MED ORDER — SOD CITRATE-CITRIC ACID 500-334 MG/5ML PO SOLN
ORAL | Status: AC
  Filled 2016-12-29: qty 15

## 2016-12-29 MED ORDER — CEFAZOLIN SODIUM-DEXTROSE 2-3 GM-% IV SOLR
INTRAVENOUS | Status: DC | PRN
  Administered 2016-12-29: 2 g via INTRAVENOUS

## 2016-12-29 MED ORDER — MEPERIDINE HCL 25 MG/ML IJ SOLN: 6.2500 mg | INTRAMUSCULAR | Status: DC | PRN

## 2016-12-29 MED ORDER — PHENYLEPHRINE 40 MCG/ML (10ML) SYRINGE FOR IV PUSH (FOR BLOOD PRESSURE SUPPORT)
PREFILLED_SYRINGE | INTRAVENOUS | Status: AC
  Filled 2016-12-29: qty 10

## 2016-12-29 MED ORDER — PHENYLEPHRINE HCL 10 MG/ML IJ SOLN
INTRAMUSCULAR | Status: DC | PRN
  Administered 2016-12-29 (×2): 80 ug via INTRAVENOUS

## 2016-12-29 MED ORDER — DIBUCAINE 1 % RE OINT: 1.0000 "application " | TOPICAL_OINTMENT | RECTAL | Status: DC | PRN

## 2016-12-29 MED ORDER — PHENYLEPHRINE 8 MG IN D5W 100 ML (0.08MG/ML) PREMIX OPTIME
INJECTION | INTRAVENOUS | Status: DC | PRN
  Administered 2016-12-29: 60 ug/min via INTRAVENOUS

## 2016-12-29 MED ORDER — SOD CITRATE-CITRIC ACID 500-334 MG/5ML PO SOLN: 30.0000 mL | Freq: Once | ORAL | Status: DC

## 2016-12-29 MED ORDER — DEXAMETHASONE SODIUM PHOSPHATE 4 MG/ML IJ SOLN
INTRAMUSCULAR | Status: DC | PRN
  Administered 2016-12-29: 4 mg via INTRAVENOUS

## 2016-12-29 MED ORDER — SENNOSIDES-DOCUSATE SODIUM 8.6-50 MG PO TABS
2.0000 | ORAL_TABLET | ORAL | Status: DC
  Administered 2016-12-29 – 2016-12-31 (×2): 2 via ORAL
  Filled 2016-12-29 (×3): qty 2

## 2016-12-29 MED ORDER — ONDANSETRON HCL 4 MG/2ML IJ SOLN
INTRAMUSCULAR | Status: AC
  Filled 2016-12-29: qty 2

## 2016-12-29 MED ORDER — ACETAMINOPHEN 325 MG PO TABS: 650.0000 mg | ORAL_TABLET | ORAL | Status: DC | PRN

## 2016-12-29 MED ORDER — TERBUTALINE SULFATE 1 MG/ML IJ SOLN: 0.2500 mg | Freq: Once | INTRAMUSCULAR | Status: DC

## 2016-12-29 MED ORDER — LACTATED RINGERS IV SOLN
INTRAVENOUS | Status: DC
  Administered 2016-12-29 – 2016-12-30 (×2): via INTRAVENOUS

## 2016-12-29 MED ORDER — DIPHENHYDRAMINE HCL 25 MG PO CAPS: 25.0000 mg | ORAL_CAPSULE | Freq: Four times a day (QID) | ORAL | Status: DC | PRN

## 2016-12-29 MED ORDER — MORPHINE SULFATE (PF) 0.5 MG/ML IJ SOLN
INTRAMUSCULAR | Status: AC
  Filled 2016-12-29: qty 10

## 2016-12-29 MED ORDER — FENTANYL CITRATE (PF) 100 MCG/2ML IJ SOLN
INTRAMUSCULAR | Status: AC
  Filled 2016-12-29: qty 2

## 2016-12-29 MED ORDER — OXYCODONE HCL 5 MG PO TABS: 10.0000 mg | ORAL_TABLET | ORAL | Status: DC | PRN

## 2016-12-29 MED ORDER — FAMOTIDINE IN NACL 20-0.9 MG/50ML-% IV SOLN
INTRAVENOUS | Status: AC
  Filled 2016-12-29: qty 50

## 2016-12-29 MED ORDER — BUPIVACAINE IN DEXTROSE 0.75-8.25 % IT SOLN
INTRATHECAL | Status: DC | PRN
  Administered 2016-12-29: 11 mL via INTRATHECAL

## 2016-12-29 MED ORDER — ONDANSETRON HCL 4 MG/2ML IJ SOLN
INTRAMUSCULAR | Status: DC | PRN
  Administered 2016-12-29: 4 mg via INTRAVENOUS

## 2016-12-29 MED ORDER — TERBUTALINE SULFATE 1 MG/ML IJ SOLN
INTRAMUSCULAR | Status: AC
  Filled 2016-12-29: qty 1

## 2016-12-29 MED ORDER — TETANUS-DIPHTH-ACELL PERTUSSIS 5-2.5-18.5 LF-MCG/0.5 IM SUSP: 0.5000 mL | Freq: Once | INTRAMUSCULAR | Status: DC

## 2016-12-29 MED ORDER — WITCH HAZEL-GLYCERIN EX PADS: 1.0000 "application " | MEDICATED_PAD | CUTANEOUS | Status: DC | PRN

## 2016-12-29 MED ORDER — PHENYLEPHRINE 8 MG IN D5W 100 ML (0.08MG/ML) PREMIX OPTIME
INJECTION | INTRAVENOUS | Status: AC
  Filled 2016-12-29: qty 100

## 2016-12-29 MED ORDER — SIMETHICONE 80 MG PO CHEW
80.0000 mg | CHEWABLE_TABLET | Freq: Three times a day (TID) | ORAL | Status: DC
  Administered 2016-12-30 – 2017-01-01 (×7): 80 mg via ORAL
  Filled 2016-12-29 (×7): qty 1

## 2016-12-29 MED ORDER — FENTANYL CITRATE (PF) 100 MCG/2ML IJ SOLN: 25.0000 ug | INTRAMUSCULAR | Status: DC | PRN

## 2016-12-29 MED ORDER — ONDANSETRON HCL 4 MG/2ML IJ SOLN: 4.0000 mg | Freq: Once | INTRAMUSCULAR | Status: DC | PRN

## 2016-12-29 MED ORDER — OXYTOCIN 10 UNIT/ML IJ SOLN
INTRAMUSCULAR | Status: DC | PRN
  Administered 2016-12-29: 40 [IU] via INTRAVENOUS

## 2016-12-29 MED ORDER — SIMETHICONE 80 MG PO CHEW: 80.0000 mg | CHEWABLE_TABLET | ORAL | Status: DC | PRN

## 2016-12-29 MED ORDER — SCOPOLAMINE 1 MG/3DAYS TD PT72
MEDICATED_PATCH | TRANSDERMAL | Status: DC | PRN
  Administered 2016-12-29: 1 via TRANSDERMAL

## 2016-12-29 MED ORDER — DEXAMETHASONE SODIUM PHOSPHATE 4 MG/ML IJ SOLN
INTRAMUSCULAR | Status: AC
  Filled 2016-12-29: qty 1

## 2016-12-29 MED ORDER — LACTATED RINGERS IV SOLN
INTRAVENOUS | Status: DC | PRN
  Administered 2016-12-29: 19:00:00 via INTRAVENOUS

## 2016-12-29 MED ORDER — SCOPOLAMINE 1 MG/3DAYS TD PT72
MEDICATED_PATCH | TRANSDERMAL | Status: AC
  Filled 2016-12-29: qty 1

## 2016-12-29 MED ORDER — FENTANYL CITRATE (PF) 100 MCG/2ML IJ SOLN
INTRAMUSCULAR | Status: DC | PRN
  Administered 2016-12-29: 20 ug via INTRAVENOUS

## 2016-12-29 SURGICAL SUPPLY — 35 items
APL SKNCLS STERI-STRIP NONHPOA (GAUZE/BANDAGES/DRESSINGS) ×1
BENZOIN TINCTURE PRP APPL 2/3 (GAUZE/BANDAGES/DRESSINGS) ×1 IMPLANT
CHLORAPREP W/TINT 26ML (MISCELLANEOUS) ×2 IMPLANT
CLAMP CORD UMBIL (MISCELLANEOUS) IMPLANT
CLOTH BEACON ORANGE TIMEOUT ST (SAFETY) ×2 IMPLANT
DRSG OPSITE POSTOP 4X10 (GAUZE/BANDAGES/DRESSINGS) ×3 IMPLANT
ELECT REM PT RETURN 9FT ADLT (ELECTROSURGICAL) ×2
ELECTRODE REM PT RTRN 9FT ADLT (ELECTROSURGICAL) ×1 IMPLANT
EXTRACTOR VACUUM KIWI (MISCELLANEOUS) IMPLANT
GLOVE BIO SURGEON STRL SZ 6.5 (GLOVE) ×2 IMPLANT
GLOVE BIOGEL PI IND STRL 7.0 (GLOVE) ×1 IMPLANT
GLOVE BIOGEL PI INDICATOR 7.0 (GLOVE) ×1
GOWN STRL REUS W/TWL LRG LVL3 (GOWN DISPOSABLE) ×4 IMPLANT
KIT ABG SYR 3ML LUER SLIP (SYRINGE) IMPLANT
NDL HYPO 25X5/8 SAFETYGLIDE (NEEDLE) IMPLANT
NEEDLE HYPO 25X5/8 SAFETYGLIDE (NEEDLE) IMPLANT
NS IRRIG 1000ML POUR BTL (IV SOLUTION) ×2 IMPLANT
PACK C SECTION WH (CUSTOM PROCEDURE TRAY) ×2 IMPLANT
PAD OB MATERNITY 4.3X12.25 (PERSONAL CARE ITEMS) ×2 IMPLANT
PENCIL SMOKE EVAC W/HOLSTER (ELECTROSURGICAL) ×2 IMPLANT
RTRCTR C-SECT PINK 25CM LRG (MISCELLANEOUS) ×2 IMPLANT
STRIP CLOSURE SKIN 1/2X4 (GAUZE/BANDAGES/DRESSINGS) ×1 IMPLANT
SUT CHROMIC 1 CTX 36 (SUTURE) ×4 IMPLANT
SUT CHROMIC 2 0 CT 1 (SUTURE) ×1 IMPLANT
SUT PLAIN 0 NONE (SUTURE) IMPLANT
SUT PLAIN 2 0 XLH (SUTURE) ×2 IMPLANT
SUT VIC AB 0 CT1 27 (SUTURE) ×4
SUT VIC AB 0 CT1 27XBRD ANBCTR (SUTURE) ×2 IMPLANT
SUT VIC AB 2-0 CT1 27 (SUTURE) ×2
SUT VIC AB 2-0 CT1 TAPERPNT 27 (SUTURE) ×1 IMPLANT
SUT VIC AB 3-0 CT1 27 (SUTURE)
SUT VIC AB 3-0 CT1 TAPERPNT 27 (SUTURE) IMPLANT
SUT VIC AB 4-0 KS 27 (SUTURE) ×2 IMPLANT
TOWEL OR 17X24 6PK STRL BLUE (TOWEL DISPOSABLE) ×2 IMPLANT
TRAY FOLEY BAG SILVER LF 14FR (SET/KITS/TRAYS/PACK) ×2 IMPLANT

## 2016-12-29 NOTE — Op Note (Signed)
Operative Note    Preoperative Diagnosis Term pregnancy at 39 2/7 weeks Category 3 tracing   Postoperative Diagnosis Possible occult cord by the head  Procedure Primary Low transverse C-section with 2 layer closure of uterus  Surgeon Huel CoteKathy Stepahnie Campo, MD  Anesthesia Spinal  Fluids: EBL 724mL UOP 200mL clear IVF 2900mL LR  Findings Viable female infant in the vertex presentation.  Apgars 8,9 Possible occult cord by head.  Normal uterus, tubes and ovaries.  Specimen Placenta to pathology  Procedure Note Patient was taken to the operating room where spinal anesthesia was quickly obtained after FHT's assessed and found to be stable at 120-130.  The spinal was found to be adequate by Allis clamp test. She was prepped and draped in the normal sterile fashion in the dorsal supine position with a leftward tilt. An appropriate time out was performed. A Pfannenstiel skin incision was then made with the scalpel and carried through to the underlying layer of fascia by sharp dissection and Bovie cautery. The fascia was nicked in the midline and the incision was extended laterally with Mayo scissors. The inferior aspect of the incision was grasped Coker clamps and dissected off the underlying rectus muscles. In a similar fashion the superior aspect was dissected off the rectus muscles. Rectus muscles were separated in the midline and the peritoneal cavity entered bluntly. The peritoneal incision was then extended both superiorly and inferiorly with careful attention to avoid both bowel and bladder. The Alexis self-retaining wound retractor was then placed within the incision and the lower uterine segment exposed. The bladder flap was developed with Metzenbaum scissors and pushed away from the lower uterine segment. The lower uterine segment was then incised in a transverse fashion and the cavity itself entered bluntly. The incision was extended bluntly. The infant's head was then lifted and  delivered from the incision without difficulty. The remainder of the infant delivered and the nose and mouth bulb suctioned with the cord clamped and cut as well after delayed clamping. The infant was handed off to the waiting pediatricians for assessment.  A cord pH and cord blood were collected.   The placenta was then spontaneously expressed from the uterus and the uterus cleared of all clots and debris with moist lap sponge. The uterine incision was then repaired in 2 layers the first layer was a running locked layer 1-0 chromic and the second an imbricating layer of the same suture. The tubes and ovaries were inspected and the gutters cleared of all clots and debris. The uterine incision was inspected and found to be hemostatic. All instruments and sponges as well as the Alexis retractor were then removed from the abdomen. The rectus muscles and peritoneum were then reapproximated with a running suture of 2-0 Vicryl. The fascia was then closed with 0 Vicryl in a running fashion. Subcutaneous tissue was reapproximated with 3-0 chromic in a running fashion. The skin was closed with a subcuticular stitch of 4-0 Vicryl on a Keith needle and then reinforced with benzoin and Steri-Strips. At the conclusion of the procedure all instruments and sponge counts were correct. Patient was taken to the recovery room in good condition with her baby accompanying her skin to skin.

## 2016-12-29 NOTE — Transfer of Care (Signed)
Immediate Anesthesia Transfer of Care Note  Patient: Yvonne [name]  Procedure(s) Performed: Procedure(s): CESAREAN SECTION (N/A)  Patient Location: PACU  Anesthesia Type:Spinal  Level of Consciousness: awake, alert  and oriented  Airway & Oxygen Therapy: Patient Spontanous Breathing  Post-op Assessment: Report given to RN and Post -op Vital signs reviewed and stable  Post vital signs: Reviewed  Last Vitals:  Vitals:   12/29/16 1747  BP: 108/66  Pulse: (!) 110  Resp: 18  Temp: 37.2 C  SpO2: 100%    Last Pain:  Vitals:   12/29/16 1748  TempSrc:   PainSc: 10-Worst pain ever         Complications: No apparent anesthesia complications

## 2016-12-29 NOTE — MAU Note (Signed)
Present to mau with contractions that have not gotten better since d/c earlier today.  Denies LOF, Vaginal bleeding.  +fm

## 2016-12-29 NOTE — Anesthesia Procedure Notes (Signed)
Spinal  Patient location during procedure: OR Start time: 12/29/2016 6:50 PM End time: 12/29/2016 7:55 PM Staffing Anesthesiologist: Dejan Angert Preanesthetic Checklist Completed: patient identified, site marked, surgical consent, pre-op evaluation, timeout performed, IV checked, risks and benefits discussed and monitors and equipment checked Spinal Block Patient position: sitting Prep: DuraPrep Patient monitoring: heart rate, cardiac monitor, continuous pulse ox and blood pressure Approach: midline Location: L3-4 Injection technique: single-shot Needle Needle type: Sprotte  Needle gauge: 24 G Needle length: 9 cm Assessment Sensory level: T4

## 2016-12-29 NOTE — H&P (Signed)
Yvonne [name] is a 26 y.o. female G1P0 at 37 2/7 weeks (EDD 01/03/17 by 7 week [place] inconsistent with LMP) presented to L&D for worsening contractions every 2-3 minutes.  Pt was seen earlier this AM for a labor evaluation and d/c home with no cervical change at 1 cm and a reassuring fetal tracing after extended monitoring. Prenatal care uneventful except +GBS.     OB History    Gravida Para Term Preterm AB Living   1 1 1  0 0 1   SAB TAB Ectopic Multiple Live Births   0 0 0 0 1     Past Medical History:  Diagnosis Date  . ADHD (attention deficit hyperactivity disorder)    off meds since 2011  . Chlamydia 02/2012   treated at [facility]   History reviewed. No pertinent surgical history. Family History: family history includes Cancer in her father and paternal aunt; Diabetes in her father; Drug abuse in her paternal uncle; Hypertension in her father and mother. Social History:  reports that she has never smoked. She has never used smokeless tobacco. She reports that she drinks alcohol. She reports that she does not use drugs.     Maternal Diabetes: No Genetic Screening: Normal Maternal Ultrasounds/Referrals: Normal Fetal Ultrasounds or other Referrals:  None Maternal Substance Abuse:  No Significant Maternal Medications:  None Significant Maternal Lab Results:  Lab values include: Group B Strep positive Other Comments:  None  Review of Systems  Gastrointestinal: Positive for abdominal pain and vomiting.   Maternal Medical History:  Reason for admission: Contractions.   Contractions: Onset was 13-24 hours ago.   Frequency: regular.   Perceived severity is strong.    Prenatal complications: No bleeding.   Prenatal Complications - Diabetes: none.    Dilation: 4 Effacement (%): 100 Station: -3 Exam by:: dr Senaida Ores Blood pressure 108/66, pulse (!) 110, temperature 98.9 F (37.2 C), temperature source Oral, resp. rate 18, height 5' (1.524 m), weight 64.4 kg (142  lb), SpO2 100 %, unknown if currently breastfeeding. Maternal Exam:  Uterine Assessment: Contraction strength is moderate.  Contraction frequency is regular.   Abdomen: Patient reports no abdominal tenderness. Fetal presentation: vertex  Introitus: Normal vulva. Normal vagina.    Fetal Exam Fetal Monitor Review: Pattern: late decelerations and prolonged decelerations.    Fetal State Assessment: Category III - tracings are abnormal.     Physical Exam  Cardiovascular: Normal rate.   Respiratory: Effort normal.  GI: Soft.  Genitourinary: Vagina normal.  Neurological: She is alert.  Psychiatric: She has a normal mood and affect.    Prenatal labs: ABO, Rh: A/Positive/-- (02/08 0000) Antibody: Negative (02/08 0000) Rubella: Immune (02/08 0000) RPR: Nonreactive (02/08 0000)  HBsAg: Positive (02/08 0000)  HIV: Non-reactive (02/08 0000)  GBS: Positive (08/09 0000)  First trimester screen WNL One hour GCT 75  Assessment/Plan: Upon admission in MAU patient was noted to have repetitive variable and late decelerations.  She was given an IV bolus, oxygen and multiple position changes attempted as well as terbutaline x1. Despite all interventions, she continued to have decelerations and one noted to be prolonged for 7 minutes to the 70-80's.  There was initially moderate variability, but that became minimal after the prolonged deceleration.  Her cervix remained 4cm.  I d/w the patient I recommended we proceed with c-section given the Category 3 tracing despite interventions and still remote from delivery.  We discussed the risks of bleeding, infection and possible damage to bowel and bladder and  promptly proceeded to OR. Prolonged decel was from 1821 to 1828, I called c-section at 1840pm and we were in the OR by 1850 for spinal.  FHR just prior to spinal was 120-130.   Oliver PilaKathy W Marvell Stavola 12/29/2016, 7:57 PM

## 2016-12-29 NOTE — Progress Notes (Signed)
Dr Senaida Oresichardson notified of pts return to MAU and chief complaints, fetal heart tracing.  Admission orders received.  Orders for IV, o2

## 2016-12-29 NOTE — MAU Provider Note (Signed)
Pt is a 26yo G1 female who presented with complaint of contractions. Pt had no cervical change in over two hours of monitoring.  Cervix : remained 1cm EFM - 140,had two subtle late decels with spontaneous recovery, +accels and maintained great variability with extended monitoring afterwards; cat 1  A: Latent labor P: Discharge to home with labor precautions

## 2016-12-29 NOTE — Progress Notes (Signed)
bicitra

## 2016-12-29 NOTE — Discharge Instructions (Signed)

## 2016-12-29 NOTE — Anesthesia Preprocedure Evaluation (Signed)
Anesthesia Evaluation  Patient identified by MRN, date of birth, ID band Patient awake    Reviewed: Allergy & Precautions, H&P , NPO status , Patient's Chart, lab work & pertinent test results, reviewed documented beta blocker date and time   Airway Mallampati: II  TM Distance: >3 FB Neck ROM: full    Dental no notable dental hx.    Pulmonary neg pulmonary ROS,    Pulmonary exam normal breath sounds clear to auscultation       Cardiovascular negative cardio ROS Normal cardiovascular exam Rhythm:regular Rate:Normal     Neuro/Psych negative neurological ROS  negative psych ROS   GI/Hepatic negative GI ROS, Neg liver ROS,   Endo/Other  negative endocrine ROS  Renal/GU negative Renal ROS  negative genitourinary   Musculoskeletal   Abdominal   Peds  Hematology negative hematology ROS (+)   Anesthesia Other Findings   Reproductive/Obstetrics (+) Pregnancy                             Anesthesia Physical Anesthesia Plan  ASA: II  Anesthesia Plan: Spinal   Post-op Pain Management:    Induction:   PONV Risk Score and Plan:   Airway Management Planned:   Additional Equipment:   Intra-op Plan:   Post-operative Plan:   Informed Consent: I have reviewed the patients History and Physical, chart, labs and discussed the procedure including the risks, benefits and alternatives for the proposed anesthesia with the patient or authorized representative who has indicated his/her understanding and acceptance.     Plan Discussed with:   Anesthesia Plan Comments:         Anesthesia Quick Evaluation  

## 2016-12-29 NOTE — Anesthesia Postprocedure Evaluation (Signed)
Anesthesia Post Note  Patient: Yvonne StainMariah [name]  Procedure(s) Performed: Procedure(s) (LRB): CESAREAN SECTION (N/A)     Patient location during evaluation: PACU Anesthesia Type: Spinal Level of consciousness: oriented and awake and alert Pain management: pain level controlled Vital Signs Assessment: post-procedure vital signs reviewed and stable Respiratory status: spontaneous breathing, respiratory function stable and patient connected to nasal cannula oxygen Cardiovascular status: blood pressure returned to baseline and stable Postop Assessment: no headache and no backache Anesthetic complications: no    Last Vitals:  Vitals:   12/29/16 2030 12/29/16 2045  BP: (!) 100/48 105/63  Pulse: (!) 126 (!) 120  Resp: (!) 25 (!) 26  Temp: 36.8 C   SpO2: 100% 100%    Last Pain:  Vitals:   12/29/16 2045  TempSrc:   PainSc: 0-No pain   Pain Goal:                 Oshae Simmering

## 2016-12-29 NOTE — Progress Notes (Signed)
0.25 terbutaline sq

## 2016-12-29 NOTE — MAU Note (Signed)
Pt reports contractions that started tonight. Pt has not timed them. Pt denies LOF or vaginal bleeding. Reports good fetal movement. States she was 1cm on last exam.

## 2016-12-30 ENCOUNTER — Encounter (HOSPITAL_COMMUNITY): Payer: Self-pay | Admitting: Obstetrics and Gynecology

## 2016-12-30 LAB — CBC
HCT: 28 % — ABNORMAL LOW (ref 36.0–46.0)
Hemoglobin: 9.4 g/dL — ABNORMAL LOW (ref 12.0–15.0)
MCH: 26.9 pg (ref 26.0–34.0)
MCHC: 33.6 g/dL (ref 30.0–36.0)
MCV: 80.2 fL (ref 78.0–100.0)
PLATELETS: 137 10*3/uL — AB (ref 150–400)
RBC: 3.49 MIL/uL — AB (ref 3.87–5.11)
RDW: 14.2 % (ref 11.5–15.5)
WBC: 22 10*3/uL — ABNORMAL HIGH (ref 4.0–10.5)

## 2016-12-30 LAB — RPR: RPR: NONREACTIVE

## 2016-12-30 LAB — ABO/RH: ABO/RH(D): A POS

## 2016-12-30 NOTE — Lactation Note (Signed)
This note was copied from a baby's chart. Lactation Consultation Note  Patient Name: Boy Benjamin StainMariah Ndiaye ZOXWR'UToday's Date: 12/30/2016 Reason for consult: Initial assessment  Baby 18 hours old. Mom reports that baby is latching and nursing, the pushing away. Mom states that she then latches baby to opposite breast, and baby settles down and nurses. However, mom believes baby is starting to act sleepy at breast. Enc mom to call for latch assist when baby cueing to nurse. [facility], aware of OP/BFSG and LC phone line assistance after D/C.   Maternal Data Has patient been taught Hand Expression?: Yes Does the patient have breastfeeding experience prior to this delivery?: No  Feeding Length of feed: 5 min  LATCH Score                   Interventions    Lactation Tools Discussed/Used     Consult Status Consult Status: Follow-up Date: 12/30/16 Follow-up type: In-patient    Sherlyn HayJennifer D Asif Muchow 12/30/2016, 1:08 PM

## 2016-12-30 NOTE — Addendum Note (Signed)
Addendum  created 12/30/16 0746 by Earmon PhoenixWilkerson, Sukaina Toothaker P, CRNA   Sign clinical note

## 2016-12-30 NOTE — Discharge Instructions (Signed)
Iron-Rich Diet  Iron is a mineral that helps your body to produce hemoglobin. Hemoglobin is a protein in your red blood cells that carries oxygen to your body's tissues. Eating too little iron may cause you to feel weak and tired, and it can increase your risk for infection. Eating enough iron is necessary for your body's metabolism, muscle function, and nervous system.  Iron is naturally found in many foods. It can also be added to foods or fortified in foods. There are two types of dietary iron:   Heme iron. Heme iron is absorbed by the body more easily than nonheme iron. Heme iron is found in meat, poultry, and fish.   Nonheme iron. Nonheme iron is found in dietary supplements, iron-fortified grains, beans, and vegetables.    You may need to follow an iron-rich diet if:   You have been diagnosed with iron deficiency or iron-deficiency anemia.   You have a condition that prevents you from absorbing dietary iron, such as:  ? Infection in your intestines.  ? Celiac disease. This involves long-lasting (chronic) inflammation of your intestines.   You do not eat enough iron.   You eat a diet that is high in foods that impair iron absorption.   You have lost a lot of blood.   You have heavy bleeding during your menstrual cycle.   You are pregnant.    What is my plan?  Your health care provider may help you to determine how much iron you need per day based on your condition. Generally, when a person consumes sufficient amounts of iron in the diet, the following iron needs are met:   Men.  ? 14-18 years old: 11 mg per day.  ? 19-50 years old: 8 mg per day.   Women.  ? 14-18 years old: 15 mg per day.  ? 19-50 years old: 18 mg per day.  ? Over 50 years old: 8 mg per day.  ? Pregnant women: 27 mg per day.  ? Breastfeeding women: 9 mg per day.    What do I need to know about an iron-rich diet?   Eat fresh fruits and vegetables that are high in vitamin C along with foods that are high in iron. This will help  increase the amount of iron that your body absorbs from food, especially with foods containing nonheme iron. Foods that are high in vitamin C include oranges, peppers, tomatoes, and mango.   Take iron supplements only as directed by your health care provider. Overdose of iron can be life-threatening. If you were prescribed iron supplements, take them with orange juice or a vitamin C supplement.   Cook foods in pots and pans that are made from iron.   Eat nonheme iron-containing foods alongside foods that are high in heme iron. This helps to improve your iron absorption.   Certain foods and drinks contain compounds that impair iron absorption. Avoid eating these foods in the same meal as iron-rich foods or with iron supplements. These include:  ? Coffee, black tea, and red wine.  ? Milk, dairy products, and foods that are high in calcium.  ? Beans, soybeans, and peas.  ? Whole grains.   When eating foods that contain both nonheme iron and compounds that impair iron absorption, follow these tips to absorb iron better.  ? Soak beans overnight before cooking.  ? Soak whole grains overnight and drain them before using.  ? Ferment flours before baking, such as using yeast in bread dough.    What foods can I eat?  Grains  Iron-fortified breakfast cereal. Iron-fortified whole-wheat bread. Enriched rice. Sprouted grains.  Vegetables  Spinach. Potatoes with skin. Green peas. Broccoli. Red and green bell peppers. Fermented vegetables.  Fruits  Prunes. Raisins. Oranges. Strawberries. Mango. Grapefruit.  Meats and Other Protein Sources  Beef liver. Oysters. Beef. Shrimp. Turkey. Chicken. Tuna. Sardines. Chickpeas. Nuts. Tofu.  Beverages  Tomato juice. Fresh orange juice. Prune juice. Hibiscus tea. Fortified instant breakfast shakes.  Condiments  Tahini. Fermented soy sauce.  Sweets and Desserts  Black-strap molasses.  Other  Wheat germ.  The items listed above may not be a complete list of recommended foods or beverages.  Contact your dietitian for more options.  What foods are not recommended?  Grains  Whole grains. Bran cereal. Bran flour. Oats.  Vegetables  Artichokes. Brussels sprouts. Kale.  Fruits  Blueberries. Raspberries. Strawberries. Figs.  Meats and Other Protein Sources  Soybeans. Products made from soy protein.  Dairy  Milk. Cream. Cheese. Yogurt. Cottage cheese.  Beverages  Coffee. Black tea. Red wine.  Sweets and Desserts  Cocoa. Chocolate. Ice cream.  Other  Basil. Oregano. Parsley.  The items listed above may not be a complete list of foods and beverages to avoid. Contact your dietitian for more information.  This information is not intended to replace advice given to you by your health care provider. Make sure you discuss any questions you have with your health care provider.  Document Released: 11/26/2004 Document Revised: 11/02/2015 Document Reviewed: 11/09/2013  Elsevier Interactive Patient Education  2018 [employer]

## 2016-12-30 NOTE — Progress Notes (Signed)
Subjective: Postpartum Day #1: Cesarean Delivery Patient reports incisional pain and tolerating PO.    Objective: Vital signs in last 24 hours: Temp:  [98.2 F (36.8 C)-99.9 F (37.7 C)] 98.2 F (36.8 C) (09/04 0530) Pulse Rate:  [85-126] 85 (09/04 0530) Resp:  [15-26] 18 (09/04 0530) BP: (85-131)/(38-119) 99/55 (09/04 0530) SpO2:  [96 %-100 %] 97 % (09/04 0530) Weight:  [142 lb (64.4 kg)] 142 lb (64.4 kg) (09/03 1747)  Physical Exam:  General: alert Lochia: appropriate Uterine Fundus: firm Incision: dressing C/D/I   Recent Labs  12/29/16 1805 12/30/16 0538  HGB 11.8* 9.4*  HCT 36.3 28.0*    Assessment/Plan: Status post Cesarean section. Doing well postoperatively.  Continue current care, ambulate.  She will pay hospital fee for circ today and we will do it tomorrow.  Leighton Roachodd D Karishma Unrein 12/30/2016, 8:12 AM

## 2016-12-30 NOTE — Lactation Note (Signed)
This note was copied from a baby's chart. Lactation Consultation Note  Patient Name: Yvonne [name] WJXBJ'YToday's Date: 12/30/2016 Reason for consult: Follow-up assessment  Baby 18 hours old. Assisted mom with latching baby first in football position and then in cross-cradle position to left breast. Baby chomping and tongue sucking, and not willing to open wide for latch. Demonstrated suck-training, and baby able to form a seal around this LC's gloved finger. Mom able to express breasts with colostrum flowing. Demonstrated to mom how to support baby's head and her breast and baby able to latch deeply. Baby able to maintain a deep latch and suckle rhythmically with a few swallows noted. Enc mom to provide suck-training just prior to latching as needed. Discussed with mom that if baby sleepy at breast, she can hand express and spoon-feed after baby nurses. Discussed assessment and interventions with Kathie RhodesBetty, RN.      Maternal Data Has patient been taught Hand Expression?: Yes Does the patient have breastfeeding experience prior to this delivery?: No  Feeding Feeding Type: Breast Fed Length of feed: 5 min  LATCH Score Latch: Grasps breast easily, tongue down, lips flanged, rhythmical sucking.  Audible Swallowing: A few with stimulation  Type of Nipple: Everted at rest and after stimulation  Comfort (Breast/Nipple): Soft / non-tender  Hold (Positioning): Assistance needed to correctly position infant at breast and maintain latch.  LATCH Score: 8  Interventions Interventions: Breast feeding basics reviewed;Assisted with latch;Skin to skin;Hand express;Breast compression;Adjust position;Support pillows;Position options;Expressed milk  Lactation Tools Discussed/Used     Consult Status Consult Status: Follow-up Date: 12/31/16 Follow-up type: In-patient    Sherlyn HayJennifer D Othal Kubitz 12/30/2016, 1:58 PM

## 2016-12-30 NOTE — Anesthesia Postprocedure Evaluation (Signed)
Anesthesia Post Note  Patient: Benjamin StainMariah Ellner  Procedure(s) Performed: Procedure(s) (LRB): CESAREAN SECTION (N/A)     Patient location during evaluation: Mother Baby Anesthesia Type: Spinal Level of consciousness: awake Pain management: pain level controlled Vital Signs Assessment: post-procedure vital signs reviewed and stable Respiratory status: spontaneous breathing Cardiovascular status: stable Postop Assessment: no headache, no backache, spinal receding and patient able to bend at knees Anesthetic complications: no    Last Vitals:  Vitals:   12/30/16 0030 12/30/16 0530  BP: 104/61 (!) 99/55  Pulse: 99 85  Resp: 18 18  Temp: 37.1 C 36.8 C  SpO2: 96% 97%    Last Pain:  Vitals:   12/30/16 0530  TempSrc: Oral  PainSc: 0-No pain   Pain Goal:                 Edison PaceWILKERSON,Gussie Murton

## 2016-12-31 NOTE — Progress Notes (Signed)
Patient ID: Yvonne StainMariah [name], female   DOB: 01/17/1991, 26 y.o.   MRN: [id number] Pt doing well. Pain well controlled. Lochia mild. Denies any fever,chills, HA or SOB. Bonding well with baby.  VSS ABD- soft, ND EXT - no Homans  A/P: POD#2 s/p c/s - stable         Routine pp/post op care         Circ baby today

## 2016-12-31 NOTE — Plan of Care (Signed)
Problem: Urinary Elimination: Goal: Ability to reestablish a normal urinary elimination pattern will improve Outcome: Completed/Met Date Met: 12/31/16 Pt urinating without difficulty post catheter removal.  Encouraged pt to increase fluid intake and empty bladder every couple of hours.

## 2016-12-31 NOTE — Lactation Note (Signed)
This note was copied from a baby's chart. Lactation Consultation Note LC asked RN earlier in shift to set up DEBP d/t less than 6 lb baby. RN did so, encouraged mom to pump. Mom hasn't pumped.  LC went to see mom. Mom sleeping.   Patient Name: Yvonne [name]: 12/31/2016 Reason for consult: Follow-up assessment;Infant < 6lbs   Maternal Data    Feeding    LATCH Score                   Interventions    Lactation Tools Discussed/Used Pump Review: Setup, frequency, and cleaning;Milk Storage Initiated by:: RN [name] initiated:: 12/31/16   Consult Status Consult Status: Follow-up [name]: 12/31/16 Follow-up type: In-patient    Charyl DancerCARVER, Khylei Wilms G 12/31/2016, 4:34 AM

## 2017-01-01 MED ORDER — PRENATAL MULTIVITAMIN CH: 1.0000 | ORAL_TABLET | Freq: Every day | ORAL | 3 refills | Status: DC

## 2017-01-01 MED ORDER — IBUPROFEN 600 MG PO TABS: 600.0000 mg | ORAL_TABLET | Freq: Four times a day (QID) | ORAL | 1 refills | Status: DC | PRN

## 2017-01-01 MED ORDER — OXYCODONE HCL 5 MG PO TABS: ORAL_TABLET | ORAL | 0 refills | Status: DC

## 2017-01-01 NOTE — Lactation Note (Signed)
This note was copied from a baby's chart. Lactation Consultation Note  Patient Name: Yvonne [name] ZOXWR'UToday's Date: 01/01/2017 Reason for consult: Follow-up assessment;Infant weight loss  Day of discharge, baby 6463 hrs old. Baby gained 1.4 oz.  Mom has been breastfeeding well, though having difficulty latching baby onto left side.  Offered assist. Baby placed STS on Mom's chest.  Mom's breasts filling, hand expression easily expressed transitional milk.  Baby latched onto right side fairly well, assisted with how to sandwich breast to facilitate a deeper latch.  Identified multiple swallows for Mom.  Moved onto left breast in football hold.  Mom guided to support and sandwich breast.  Baby latched deeply onto breast, Mom denies any discomfort.  Breasts softening post feeding.  Encouraged alternate breast compression during feedings to increase milk transfer.  Encouraged STS and feeding baby on cues, goal of 8-12 feedings per 24 hrs.   Engorgement prevention and treatment discussed. Mom aware of OP lactation services, and encouraged her to call prn.  Consult Status Consult Status: Complete Date: 01/01/17 Follow-up type: Call as needed    Judee ClaraSmith, Birney Belshe E 01/01/2017, 10:18 AM

## 2017-01-01 NOTE — Discharge Summary (Signed)
OB Discharge Summary     Patient Name: Yvonne StainMariah Emberson DOB: 02/24/1991 MRN: [id number]  Date of admission: 12/29/2016 Delivering MD: Huel CoteICHARDSON, KATHY   Date of discharge: 01/01/2017  Admitting diagnosis: 39wks CTX back to back Intrauterine pregnancy: 481w2d     Secondary diagnosis:  Active Problems:   S/P primary low transverse C-section  Additional problems: ?occult umbilical cord causing decels     Discharge diagnosis: Term Pregnancy Delivered                                                                                                Post partum procedures:N/A  Augmentation: none  Complications: None  Hospital course:  Onset of Labor With Unplanned C/S  26 y.o. yo G1P1001 at 4281w2d was admitted in Active Labor on 12/29/2016. Patient had a labor course significant for repetitive decels. Membrane Rupture Time/Date: 6:58 PM ,12/29/2016   The patient went for cesarean section due to Non-Reassuring FHR, and delivered a Viable infant,12/29/2016  Details of operation can be found in separate operative note. Patient had an uncomplicated postpartum course.  She is ambulating,tolerating a regular diet, passing flatus, and urinating well.  Patient is discharged home in stable condition 01/01/17.  Physical exam  Vitals:   12/30/16 1700 12/31/16 0540 12/31/16 1806 01/01/17 0628  BP: (!) 98/56 100/61 114/67 (!) 94/57  Pulse: 65 77 (!) 113 96  Resp: 18 18 20 16   Temp: 98.2 F (36.8 C) 97.9 F (36.6 C) 98.9 F (37.2 C) 98.2 F (36.8 C)  TempSrc: Oral Oral Oral Oral  SpO2: 98% 100%  100%  Weight:      Height:       General: alert and no distress Lochia: appropriate Uterine Fundus: firm Incision: Healing well with no significant drainage DVT Evaluation: No evidence of DVT seen on physical exam. Labs: Lab Results  Component Value Date   WBC 22.0 (H) 12/30/2016   HGB 9.4 (L) 12/30/2016   HCT 28.0 (L) 12/30/2016   MCV 80.2 12/30/2016   PLT 137 (L) 12/30/2016   No flowsheet data  found.  Discharge instruction: per After Visit Summary and "Baby and Me Booklet".  After visit meds:  Allergies as of 01/01/2017   No Known Allergies     Medication List    TAKE these medications   calcium carbonate 500 MG chewable tablet Commonly known as:  TUMS - dosed in mg elemental calcium Chew 2 tablets by mouth daily as needed for indigestion or heartburn.   ergocalciferol 50000 units capsule Commonly known as:  VITAMIN D2 Take 1 capsule (50,000 Units total) by mouth once a week.   ibuprofen 600 MG tablet Commonly known as:  ADVIL,MOTRIN Take 1 tablet (600 mg total) by mouth every 6 (six) hours as needed.   oxyCODONE 5 MG immediate release tablet Commonly known as:  Oxy IR/ROXICODONE 1-2 TABLETS Q 6HR PRN SEVERE PAIN   prenatal multivitamin Tabs tablet Take 1 tablet by mouth daily at 12 noon.            Discharge Care Instructions        Start  Ordered   01/01/17 0000  ibuprofen (ADVIL,MOTRIN) 600 MG tablet  Every 6 hours PRN     01/01/17 0904   01/01/17 0000  Prenatal Vit-Fe Fumarate-FA (PRENATAL MULTIVITAMIN) TABS tablet  Daily     01/01/17 0904   01/01/17 0000  oxyCODONE (OXY IR/ROXICODONE) 5 MG immediate release tablet     01/01/17 0904   01/01/17 0000  Increase activity slowly     01/01/17 0904   01/01/17 0000  Diet - low sodium heart healthy     01/01/17 0904   01/01/17 0000  Discharge instructions    Comments:  CALL [phone] WITH questions or problems   01/01/17 0904   01/01/17 0000  May walk up steps     01/01/17 0904   01/01/17 0000  May shower / Bathe     01/01/17 0904   01/01/17 0000  Driving Restrictions    Comments:  While taking strong pain medicine   01/01/17 0904   01/01/17 0000  Sexual Activity Restrictions    Comments:  Pelvic rest - no douching, tampons or sex for 6 weeks   01/01/17 0904   01/01/17 0000  Lifting restrictions    Comments:  No greater than 10-15lbs for 6 weeks   01/01/17 0904   01/01/17 0000  Call MD for:   persistant nausea and vomiting     01/01/17 0904   01/01/17 0000  Call MD for:  severe uncontrolled pain     01/01/17 0904   01/01/17 0000  Call MD for:  redness, tenderness, or signs of infection (pain, swelling, redness, odor or green/yellow discharge around incision site)     01/01/17 0904   12/29/16 0000  OB RESULT CONSOLE Group B Strep    Comments:  This external order was created through the Results Console.   12/29/16 1807   12/29/16 0000  OB RESULTS CONSOLE GC/Chlamydia    Comments:  This external order was created through the Results Console.   12/29/16 1807   12/29/16 0000  OB RESULTS CONSOLE RPR    Comments:  This external order was created through the Results Console.    12/29/16 1807   12/29/16 0000  OB RESULTS CONSOLE HIV antibody    Comments:  This external order was created through the Results Console.    12/29/16 1807   12/29/16 0000  OB RESULTS CONSOLE Rubella Antibody    Comments:  This external order was created through the Results Console.    12/29/16 1807   12/29/16 0000  OB RESULTS CONSOLE Hepatitis B surface antigen    Comments:  This external order was created through the Results Console.    12/29/16 1807   12/29/16 0000  OB RESULTS CONSOLE ABO/Rh    Comments:  This external order was created through the Results Console.    12/29/16 1807   12/29/16 0000  OB RESULTS CONSOLE Antibody Screen    Comments:  This external order was created through the Results Console.    12/29/16 1807   12/29/16 0000  OB RESULTS CONSOLE Hepatitis B surface antigen    Comments:  This external order was created through the Results Console.    12/29/16 1959   12/29/16 0000  OB RESULTS CONSOLE Hepatitis B surface antigen    Comments:  This external order was created through the Results Console.    12/29/16 2000   12/29/16 0000  OB RESULTS CONSOLE Hepatitis B surface antigen    Comments:  This external order was created through  the Results Console.    12/29/16 2001    12/29/16 0000  OB RESULTS CONSOLE Hepatitis B surface antigen    Comments:  This external order was created through the Results Console.    12/29/16 2002      Diet: routine diet  Activity: Advance as tolerated. Pelvic rest for 6 weeks.   Outpatient follow up:2 and 6 weeks Follow up Appt:No future appointments. Follow up Visit:No Follow-up on file.  Postpartum contraception: Undecided  Newborn Data: Live born female  Birth Weight: 6 lb 3.5 oz (2820 g) APGAR: 9, 9  Baby Feeding: Breast Disposition:home with mother   01/01/2017 Sherian Rein, MD

## 2017-01-01 NOTE — Progress Notes (Signed)
Subjective: Postpartum Day 3: Cesarean Delivery Patient reports incisional pain and tolerating PO.    Objective: Vital signs in last 24 hours: Temp:  [98.2 F (36.8 C)-98.9 F (37.2 C)] 98.2 F (36.8 C) (09/06 0628) Pulse Rate:  [96-113] 96 (09/06 0628) Resp:  [16-20] 16 (09/06 0628) BP: (94-114)/(57-67) 94/57 (09/06 0628) SpO2:  [100 %] 100 % (09/06 16100628)  Physical Exam:  General: alert and no distress Lochia: appropriate Uterine Fundus: firm Incision: healing well DVT Evaluation: No evidence of DVT seen on physical exam.   Recent Labs  12/29/16 1805 12/30/16 0538  HGB 11.8* 9.4*  HCT 36.3 28.0*    Assessment/Plan: Status post Cesarean section. Doing well postoperatively.  Discharge home with standard precautions and return to clinic in 2 weeks. D/C with motrin, percocet and PNV   Faithanne Verret Bovard-Stuckert 01/01/2017, 8:06 AM

## 2017-02-10 DIAGNOSIS — Z3009 Encounter for other general counseling and advice on contraception: Secondary | ICD-10-CM | POA: Diagnosis not present

## 2017-02-25 DIAGNOSIS — Z30430 Encounter for insertion of intrauterine contraceptive device: Secondary | ICD-10-CM | POA: Diagnosis not present

## 2017-02-25 DIAGNOSIS — Z3202 Encounter for pregnancy test, result negative: Secondary | ICD-10-CM | POA: Diagnosis not present

## 2017-06-11 ENCOUNTER — Encounter: Payer: Self-pay | Admitting: Family Medicine

## 2017-06-11 ENCOUNTER — Ambulatory Visit (INDEPENDENT_AMBULATORY_CARE_PROVIDER_SITE_OTHER): Payer: BLUE CROSS/BLUE SHIELD | Admitting: Family Medicine

## 2017-06-11 VITALS — BP 112/70 | HR 72 | Temp 97.9°F | Ht 60.5 in | Wt 138.4 lb

## 2017-06-11 DIAGNOSIS — Z23 Encounter for immunization: Secondary | ICD-10-CM

## 2017-06-11 DIAGNOSIS — H6121 Impacted cerumen, right ear: Secondary | ICD-10-CM

## 2017-06-11 DIAGNOSIS — H60502 Unspecified acute noninfective otitis externa, left ear: Secondary | ICD-10-CM | POA: Diagnosis not present

## 2017-06-11 MED ORDER — NEOMYCIN-POLYMYXIN-HC 3.5-10000-1 OT SOLN
4.0000 [drp] | Freq: Four times a day (QID) | OTIC | 0 refills | Status: DC
Start: 2017-06-11 — End: 2018-03-10

## 2017-06-11 MED ORDER — AMOXICILLIN 875 MG PO TABS: 875.0000 mg | ORAL_TABLET | Freq: Two times a day (BID) | ORAL | 0 refills | Status: DC

## 2017-06-11 NOTE — Progress Notes (Signed)
Chief Complaint  Patient presents with  . Ear Fullness    2-3 weeks her ear has been bothering her, says it feels like stuff is coming out of her ear.    She is having drainage from her left ear (uses qtip, not putting in deep).  It is yellow and smells bad.  She has noted slight bleeding from the ear as well.  Had slight ear discomfort, only intermittently. Started hurting more in the last couple of days. Left ear has been stopping up, but if she moves it around, it "unstops".  Some decreased hearing, which makes it a little hard at work (in [employer]).  Occasional stopped up nose, but no significant URI symptoms.  No fever, chills.  Breastfeeding Has Mirena IUD  PMH, PSH, SH reviewed and updated today  Social History   Socioeconomic History  . Marital status: Single    Spouse name: Not on file  . Number of children: Not on file  . Years of education: Not on file  . Highest education level: Not on file  Social Needs  . Financial resource strain: Not on file  . Food insecurity - worry: Not on file  . Food insecurity - inability: Not on file  . Transportation needs - medical: Not on file  . Transportation needs - non-medical: Not on file  Occupational History  . Not on file  Tobacco Use  . Smoking status: Never Smoker  . Smokeless tobacco: Never Used  Substance and Sexual Activity  . Alcohol use: No    Alcohol/week: 0.0 oz    Frequency: Never    Comment: none since nursing (prev 1-2/month)  . Drug use: No  . Sexual activity: Yes    Partners: Male    Birth control/protection: IUD    Comment: Mirena  Other Topics Concern  . Not on file  Social History Narrative   Working at [employer].  Lives with her mom, and her son (born 12/2016).   [place] in [place]   Outpatient Encounter Medications as of 06/11/2017  Medication Sig  . Prenatal Vit-Fe Fumarate-FA (PRENATAL MULTIVITAMIN) TABS tablet Take 1 tablet by mouth daily at 12 noon.  Marland Kitchen. ibuprofen (ADVIL,MOTRIN)  600 MG tablet Take 1 tablet (600 mg total) by mouth every 6 (six) hours as needed. (Patient not taking: Reported on 06/11/2017)  . [DISCONTINUED] calcium carbonate (TUMS - DOSED IN MG ELEMENTAL CALCIUM) 500 MG chewable tablet Chew 2 tablets by mouth daily as needed for indigestion or heartburn.  . [DISCONTINUED] ergocalciferol (VITAMIN D2) 50000 units capsule Take 1 capsule (50,000 Units total) by mouth once a week.  . [DISCONTINUED] oxyCODONE (OXY IR/ROXICODONE) 5 MG immediate release tablet 1-2 TABLETS Q 6HR PRN SEVERE PAIN   No facility-administered encounter medications on file as of 06/11/2017.    No Known Allergies  ROS:  No fever, chills, URI symptoms other than ear complaints as per HPI.  No nausea, vomiting, rash, GI or GU complaints  PHYSICAL EXAM: BP 112/70   Pulse 72   Temp 97.9 F (36.6 C) (Tympanic)   Ht 5' 0.5" (1.537 m)   Wt 138 lb 6.4 oz (62.8 kg)   Breastfeeding? Yes   BMI 26.58 kg/m  Pleasant, well-appearing female in no distress HEENT: PERRL, EOMI, conjunctiva and sclera are clear. Right TM is not visualized due to obstructing cerumen L EAC shows swelling anteriorly, with mod amount of thin yellow drainage occluding the canal.  Pain when pressing anteriorly on tragus No pain with movement of  pinna OP is clear. Sinuses nontender Neck: no significant lymphadenopathy Heart; regular rate and rhythm Lungs clear bilateraly Skin: normal turgor, no rash Neuro: alert and oriented, cranial nerves intact Psych: normal mood, affect, hygiene and grooming  Patient tolerated ear lavage to R ear well.  Large amount of cerumen removed.  TM and EAC normal on f/u exam.   ASSESSMENT/PLAN:  Acute otitis externa of left ear, unspecified type - advised on how to properly use cortisporin. use 7-10d and f/u next week if not better. cannot r/o otitis media, cover with oral ABX also - Plan: amoxicillin (AMOXIL) 875 MG tablet, neomycin-polymyxin-hydrocortisone (CORTISPORIN) OTIC  solution  Impacted cerumen of right ear - sp lavage with good results  Need for influenza vaccination - Plan: Flu Vaccine QUAD 6+ mos PF IM (Fluarix Quad PF)    Right cerumen impaction L otitis externa +/-media Treat with amoxil 875 BID x 7 days Cortisporin otic  F/u in 1 week for recheck if not better   Flu shot given

## 2018-01-26 ENCOUNTER — Other Ambulatory Visit (INDEPENDENT_AMBULATORY_CARE_PROVIDER_SITE_OTHER): Payer: BLUE CROSS/BLUE SHIELD

## 2018-01-26 DIAGNOSIS — Z23 Encounter for immunization: Secondary | ICD-10-CM | POA: Diagnosis not present

## 2018-02-08 ENCOUNTER — Emergency Department (HOSPITAL_COMMUNITY)
Admission: EM | Admit: 2018-02-08 | Discharge: 2018-02-08 | Disposition: A | Discharge status: Home / Self Care | Payer: Medicaid Other | Attending: Emergency Medicine | Admitting: Emergency Medicine

## 2018-02-08 ENCOUNTER — Encounter (HOSPITAL_COMMUNITY): Payer: Self-pay | Admitting: Emergency Medicine

## 2018-02-08 DIAGNOSIS — L03011 Cellulitis of right finger: Secondary | ICD-10-CM | POA: Insufficient documentation

## 2018-02-08 DIAGNOSIS — Z79899 Other long term (current) drug therapy: Secondary | ICD-10-CM | POA: Insufficient documentation

## 2018-02-08 MED ORDER — DOXYCYCLINE HYCLATE 100 MG PO CAPS: 100.0000 mg | ORAL_CAPSULE | Freq: Two times a day (BID) | ORAL | 0 refills | Status: DC

## 2018-02-08 MED ORDER — LIDOCAINE HCL (PF) 1 % IJ SOLN
30.0000 mL | Freq: Once | INTRAMUSCULAR | Status: AC
  Administered 2018-02-08: 30 mL
  Filled 2018-02-08: qty 30

## 2018-02-08 NOTE — ED Triage Notes (Signed)
Pt c/o swelling and pain to R thumb x 2-3 days, swelling and redness noted.

## 2018-02-08 NOTE — Discharge Instructions (Signed)
You may pull out the packing in 2 days.  Keep your thumb clean and dry.

## 2018-02-08 NOTE — ED Provider Notes (Signed)
[facility] EMERGENCY DEPARTMENT Provider Note   CSN: [id number] Arrival date & time: 02/08/18  0250     History   Chief Complaint Chief Complaint  Patient presents with  . paronychia    HPI Yvonne [name] is a 27 y.o. female.  Patient presents to the emergency department with a chief complaint of right thumb pain.  She states that she jammed her thumb a few days ago.  Reports having swelling of the tip of her thumb.  It is tender to touch.  Denies any fever.  She has not taken anything for her symptoms.  The history is provided by the patient. No language interpreter was used.    Past Medical History:  Diagnosis Date  . ADHD (attention deficit hyperactivity disorder)    off meds since 2011  . Chlamydia 02/2012   treated at [facility]    Patient Active Problem List   Diagnosis Date Noted  . S/P primary low transverse C-section 12/29/2016  . High risk sexual behavior 01/14/2016  . Healthcare maintenance 01/14/2016  . Vitamin D deficiency 06/29/2015    Past Surgical History:  Procedure Laterality Date  . CESAREAN SECTION N/A 12/29/2016   Procedure: CESAREAN SECTION;  Surgeon: Huel Cote, MD;  Location: [facility] BIRTHING SUITES;  Service: Obstetrics;  Laterality: N/A;     OB History    Gravida  1   Para  1   Term  1   Preterm  0   AB  0   Living  1     SAB  0   TAB  0   Ectopic  0   Multiple  0   Live Births  1            Home Medications    Prior to Admission medications   Medication Sig Start Date End Date Taking? Authorizing Provider  amoxicillin (AMOXIL) 875 MG tablet Take 1 tablet (875 mg total) by mouth 2 (two) times daily. 06/11/17   Joselyn Arrow, MD  ibuprofen (ADVIL,MOTRIN) 600 MG tablet Take 1 tablet (600 mg total) by mouth every 6 (six) hours as needed. Patient not taking: Reported on 06/11/2017 01/01/17   Sherian Rein, MD  levonorgestrel (MIRENA) 20 MCG/24HR IUD 1 each by Intrauterine route once.     [provider]  neomycin-polymyxin-hydrocortisone (CORTISPORIN) OTIC solution Place 4 drops into the left ear 4 (four) times daily. 06/11/17   Joselyn Arrow, MD  Prenatal Vit-Fe Fumarate-FA (PRENATAL MULTIVITAMIN) TABS tablet Take 1 tablet by mouth daily at 12 noon. 01/01/17   Bovard-StuckertAugusto Gamble, MD    Family History Family History  Problem Relation Age of Onset  . Hypertension Mother   . Diabetes Father   . Hypertension Father   . Cancer Father        leukemia in his 72's  . Cancer Paternal Aunt        breast cancer  . Drug abuse Paternal Uncle     Social History Social History   Tobacco Use  . Smoking status: Never Smoker  . Smokeless tobacco: Never Used  Substance Use Topics  . Alcohol use: No    Alcohol/week: 0.0 standard drinks    Frequency: Never    Comment: none since nursing (prev 1-2/month)  . Drug use: No     Allergies   Patient has no known allergies.   Review of Systems Review of Systems  All other systems reviewed and are negative.    Physical Exam Updated Vital Signs BP  112/65   Pulse 84   Temp 99 F (37.2 C) (Oral)   Resp 18   Ht 5' (1.524 m)   Wt 64 kg   LMP 01/26/2018   SpO2 99%   BMI 27.54 kg/m   Physical Exam  Constitutional: She is oriented to person, place, and time. No distress.  HENT:  Head: Normocephalic and atraumatic.  Eyes: Pupils are equal, round, and reactive to light. Conjunctivae and EOM are normal.  Neck: No tracheal deviation present.  Cardiovascular: Normal rate.  Pulmonary/Chest: Effort normal. No respiratory distress.  Abdominal: Soft.  Musculoskeletal: Normal range of motion.  Neurological: She is alert and oriented to person, place, and time.  Skin: Skin is warm and dry. She is not diaphoretic.  Paronychia to right thumb, no evidence of felon  Psychiatric: Judgment normal.  Nursing note and vitals reviewed.    ED Treatments / Results  Labs (all labs ordered are listed, but only abnormal  results are displayed) Labs Reviewed - No data to display  EKG None  Radiology No results found.  Procedures Procedures (including critical care time) INCISION AND DRAINAGE Performed by: Roxy Horseman Consent: Verbal consent obtained. Risks and benefits: risks, benefits and alternatives were discussed Type: abscess  Body area: right thumb paronychia  Anesthesia: local infiltration  Incision was made with a scalpel.  Digital Block: lidocaine 1% without epinephrine  Anesthetic total: 5 ml  Complexity: complex Blunt dissection to break up loculations  Drainage: purulent  Drainage amount: moderate  Packing material: 1/4 in iodoform gauze  Patient tolerance: Patient tolerated the procedure well with no immediate complications.    Medications Ordered in ED Medications  lidocaine (PF) (XYLOCAINE) 1 % injection 30 mL (has no administration in time range)     Initial Impression / Assessment and Plan / ED Course  I have reviewed the triage vital signs and the nursing notes.  Pertinent labs & imaging results that were available during my care of the patient were reviewed by me and considered in my medical decision making (see chart for details).    Patient with paronychia to right thumb.  I&D in ED with good success, packing placed.  Doxy for home.  PCP follow-up.   Final Clinical Impressions(s) / ED Diagnoses   Final diagnoses:  Paronychia of right thumb    ED Discharge Orders         Ordered    doxycycline (VIBRAMYCIN) 100 MG capsule  2 times daily,   Status:  Discontinued     02/08/18 0416    doxycycline (VIBRAMYCIN) 100 MG capsule  2 times daily     02/08/18 0417           Roxy Horseman, PA-C 02/08/18 0421    Palumbo, April, MD 02/08/18 1610

## 2018-03-09 NOTE — Patient Instructions (Addendum)
  HEALTH MAINTENANCE RECOMMENDATIONS:  It is recommended that you get at least 30 minutes of aerobic exercise at least 5 days/week (for weight loss, you may need as much as 60-90 minutes). This can be any activity that gets your heart rate up. This can be divided in 10-15 minute intervals if needed, but try and build up your endurance at least once a week.  Weight bearing exercise is also recommended twice weekly.  Eat a healthy diet with lots of vegetables, fruits and fiber.  "Colorful" foods have a lot of vitamins (ie green vegetables, tomatoes, red peppers, etc).  Limit sweet tea, regular sodas and alcoholic beverages, all of which has a lot of calories and sugar.  Up to 1 alcoholic drink daily may be beneficial for women (unless trying to lose weight, watch sugars).  Drink a lot of water.  Calcium recommendations are 1200-1500 mg daily (1500 mg for postmenopausal women or women without ovaries), and vitamin D 1000 IU daily.  This should be obtained from diet and/or supplements (vitamins), and calcium should not be taken all at once, but in divided doses.  Monthly self breast exams and yearly mammograms for women over the age of 28 is recommended.  Sunscreen of at least SPF 30 should be used on all sun-exposed parts of the skin when outside between the hours of 10 am and 4 pm (not just when at beach or pool, but even with exercise, golf, tennis, and yard work!)  Use a sunscreen that says "broad spectrum" so it covers both UVA and UVB rays, and make sure to reapply every 1-2 hours.  Remember to change the batteries in your smoke detectors when changing your clock times in the spring and fall.  Use your seat belt every time you are in a car, and please drive safely and not be distracted with cell phones and texting while driving.  Please cut back on the carbs (breads/pasta/rice/potatoes) and the regular soda in your diet--these measures will help with weight loss.  I suspect your vitamin D level  may be low again, since you haven't been taking any vitamins in the last 7-8 months.  If it is very low, we will send in another prescription (weekly).  Once you finish the prescription (if one is sent), you will need to start on a daily supplement--either a multivitamin (with 1000 IU of D3 in it), or a separate vitamin D3. I will let you know the recommended amount once I get you results.
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
## 2018-03-10 ENCOUNTER — Other Ambulatory Visit (HOSPITAL_COMMUNITY)
Admission: RE | Admit: 2018-03-10 | Discharge: 2018-03-10 | Disposition: A | Discharge status: Home / Self Care | Payer: BLUE CROSS/BLUE SHIELD | Source: Ambulatory Visit | Attending: Family Medicine | Admitting: Family Medicine

## 2018-03-10 ENCOUNTER — Ambulatory Visit: Payer: BLUE CROSS/BLUE SHIELD | Admitting: Family Medicine

## 2018-03-10 ENCOUNTER — Encounter: Payer: Self-pay | Admitting: Family Medicine

## 2018-03-10 VITALS — BP 100/70 | HR 84 | Ht 61.0 in | Wt 143.6 lb

## 2018-03-10 DIAGNOSIS — R635 Abnormal weight gain: Secondary | ICD-10-CM

## 2018-03-10 DIAGNOSIS — Z0000 Encounter for general adult medical examination without abnormal findings: Secondary | ICD-10-CM | POA: Diagnosis not present

## 2018-03-10 DIAGNOSIS — E559 Vitamin D deficiency, unspecified: Secondary | ICD-10-CM | POA: Diagnosis not present

## 2018-03-10 DIAGNOSIS — R5383 Other fatigue: Secondary | ICD-10-CM | POA: Diagnosis not present

## 2018-03-10 LAB — POCT URINALYSIS DIP (PROADVANTAGE DEVICE)
BILIRUBIN UA: NEGATIVE
BILIRUBIN UA: NEGATIVE mg/dL
GLUCOSE UA: NEGATIVE mg/dL
Leukocytes, UA: NEGATIVE
Nitrite, UA: NEGATIVE
Protein Ur, POC: NEGATIVE mg/dL
RBC UA: NEGATIVE
SPECIFIC GRAVITY, URINE: 1.02
Urobilinogen, Ur: NEGATIVE
pH, UA: 6 (ref 5.0–8.0)

## 2018-03-11 ENCOUNTER — Other Ambulatory Visit: Payer: Self-pay | Admitting: *Deleted

## 2018-03-11 LAB — TSH: TSH: 0.666 u[IU]/mL (ref 0.450–4.500)

## 2018-03-11 LAB — CBC WITH DIFFERENTIAL/PLATELET
BASOS ABS: 0 10*3/uL (ref 0.0–0.2)
Basos: 1 %
EOS (ABSOLUTE): 0.1 10*3/uL (ref 0.0–0.4)
Eos: 1 %
HEMOGLOBIN: 12.3 g/dL (ref 11.1–15.9)
Hematocrit: 39 % (ref 34.0–46.6)
IMMATURE GRANS (ABS): 0 10*3/uL (ref 0.0–0.1)
IMMATURE GRANULOCYTES: 0 %
LYMPHS: 21 %
Lymphocytes Absolute: 1.3 10*3/uL (ref 0.7–3.1)
MCH: 26.2 pg — AB (ref 26.6–33.0)
MCHC: 31.5 g/dL (ref 31.5–35.7)
MCV: 83 fL (ref 79–97)
MONOCYTES: 12 %
Monocytes Absolute: 0.8 10*3/uL (ref 0.1–0.9)
Neutrophils Absolute: 4.1 10*3/uL (ref 1.4–7.0)
Neutrophils: 65 %
PLATELETS: 235 10*3/uL (ref 150–450)
RBC: 4.7 x10E6/uL (ref 3.77–5.28)
RDW: 11.7 % — ABNORMAL LOW (ref 12.3–15.4)
WBC: 6.3 10*3/uL (ref 3.4–10.8)

## 2018-03-11 LAB — VITAMIN D 25 HYDROXY (VIT D DEFICIENCY, FRACTURES): VIT D 25 HYDROXY: 15.4 ng/mL — AB (ref 30.0–100.0)

## 2018-03-11 LAB — HIV ANTIBODY (ROUTINE TESTING W REFLEX): HIV Screen 4th Generation wRfx: NONREACTIVE

## 2018-03-11 MED ORDER — ERGOCALCIFEROL 1.25 MG (50000 UT) PO CAPS: 50000.0000 [IU] | ORAL_CAPSULE | ORAL | 0 refills | Status: DC

## 2018-03-12 LAB — CYTOLOGY - PAP
Chlamydia: NEGATIVE
Diagnosis: NEGATIVE
Neisseria Gonorrhea: NEGATIVE

## 2018-06-11 DIAGNOSIS — H40013 Open angle with borderline findings, low risk, bilateral: Secondary | ICD-10-CM | POA: Diagnosis not present

## 2019-02-11 DIAGNOSIS — H40013 Open angle with borderline findings, low risk, bilateral: Secondary | ICD-10-CM | POA: Diagnosis not present

## 2019-02-18 ENCOUNTER — Other Ambulatory Visit: Payer: Self-pay

## 2019-02-18 ENCOUNTER — Emergency Department (HOSPITAL_COMMUNITY)
Admission: EM | Admit: 2019-02-18 | Discharge: 2019-02-18 | Disposition: A | Discharge status: Home / Self Care | Payer: BC Managed Care – PPO | Attending: Emergency Medicine | Admitting: Emergency Medicine

## 2019-02-18 ENCOUNTER — Encounter (HOSPITAL_COMMUNITY): Payer: Self-pay

## 2019-02-18 ENCOUNTER — Emergency Department (HOSPITAL_COMMUNITY): Payer: BC Managed Care – PPO

## 2019-02-18 DIAGNOSIS — Z79899 Other long term (current) drug therapy: Secondary | ICD-10-CM | POA: Insufficient documentation

## 2019-02-18 DIAGNOSIS — R112 Nausea with vomiting, unspecified: Secondary | ICD-10-CM | POA: Diagnosis not present

## 2019-02-18 DIAGNOSIS — Z20828 Contact with and (suspected) exposure to other viral communicable diseases: Secondary | ICD-10-CM | POA: Diagnosis not present

## 2019-02-18 DIAGNOSIS — R197 Diarrhea, unspecified: Secondary | ICD-10-CM | POA: Diagnosis not present

## 2019-02-18 DIAGNOSIS — Z03818 Encounter for observation for suspected exposure to other biological agents ruled out: Secondary | ICD-10-CM | POA: Diagnosis not present

## 2019-02-18 DIAGNOSIS — M791 Myalgia, unspecified site: Secondary | ICD-10-CM | POA: Diagnosis not present

## 2019-02-18 DIAGNOSIS — R1110 Vomiting, unspecified: Secondary | ICD-10-CM | POA: Diagnosis not present

## 2019-02-18 LAB — COMPREHENSIVE METABOLIC PANEL WITH GFR
ALT: 17 U/L (ref 0–44)
AST: 18 U/L (ref 15–41)
Albumin: 4.3 g/dL (ref 3.5–5.0)
Alkaline Phosphatase: 59 U/L (ref 38–126)
Anion gap: 8 (ref 5–15)
BUN: 13 mg/dL (ref 6–20)
CO2: 23 mmol/L (ref 22–32)
Calcium: 8.8 mg/dL — ABNORMAL LOW (ref 8.9–10.3)
Chloride: 109 mmol/L (ref 98–111)
Creatinine, Ser: 0.68 mg/dL (ref 0.44–1.00)
GFR calc Af Amer: >60 mL/min
GFR calc non Af Amer: >60 mL/min
Glucose, Bld: 124 mg/dL — ABNORMAL HIGH (ref 70–99)
Potassium: 3.5 mmol/L (ref 3.5–5.1)
Sodium: 140 mmol/L (ref 135–145)
Total Bilirubin: 0.8 mg/dL (ref 0.3–1.2)
Total Protein: 7.7 g/dL (ref 6.5–8.1)

## 2019-02-18 LAB — CBC
HCT: 44.2 % (ref 36.0–46.0)
Hemoglobin: 13.6 g/dL (ref 12.0–15.0)
MCH: 26.5 pg (ref 26.0–34.0)
MCHC: 30.8 g/dL (ref 30.0–36.0)
MCV: 86 fL (ref 80.0–100.0)
Platelets: 205 10*3/uL (ref 150–400)
RBC: 5.14 MIL/uL — ABNORMAL HIGH (ref 3.87–5.11)
RDW: 12.1 % (ref 11.5–15.5)
WBC: 6.2 10*3/uL (ref 4.0–10.5)
nRBC: 0 % (ref 0.0–0.2)

## 2019-02-18 LAB — URINALYSIS, ROUTINE W REFLEX MICROSCOPIC
Bacteria, UA: NONE SEEN
Bilirubin Urine: NEGATIVE
Glucose, UA: NEGATIVE mg/dL
Hgb urine dipstick: NEGATIVE
Ketones, ur: 20 mg/dL — AB
Leukocytes,Ua: NEGATIVE
Nitrite: NEGATIVE
Protein, ur: 30 mg/dL — AB
Specific Gravity, Urine: 1.032 — ABNORMAL HIGH (ref 1.005–1.030)
pH: 7 (ref 5.0–8.0)

## 2019-02-18 LAB — I-STAT BETA HCG BLOOD, ED (NOT ORDERABLE): I-stat hCG, quantitative: <5 m[IU]/mL

## 2019-02-18 LAB — LACTIC ACID, PLASMA: Lactic Acid, Venous: 1.1 mmol/L (ref 0.5–1.9)

## 2019-02-18 LAB — LIPASE, BLOOD: Lipase: 21 U/L (ref 11–51)

## 2019-02-18 MED ORDER — SODIUM CHLORIDE 0.9% FLUSH: 3.0000 mL | Freq: Once | INTRAVENOUS | Status: DC

## 2019-02-18 MED ORDER — ONDANSETRON HCL 4 MG/2ML IJ SOLN
4.0000 mg | Freq: Once | INTRAMUSCULAR | Status: AC
  Administered 2019-02-18: 4 mg via INTRAVENOUS
  Filled 2019-02-18: qty 2

## 2019-02-18 MED ORDER — SODIUM CHLORIDE 0.9 % IV BOLUS
1000.0000 mL | Freq: Once | INTRAVENOUS | Status: AC
  Administered 2019-02-18: 1000 mL via INTRAVENOUS

## 2019-02-18 MED ORDER — ONDANSETRON 4 MG PO TBDP: 4.0000 mg | ORAL_TABLET | Freq: Three times a day (TID) | ORAL | 0 refills | Status: DC | PRN

## 2019-02-18 MED ORDER — ACETAMINOPHEN 325 MG PO TABS
650.0000 mg | ORAL_TABLET | Freq: Once | ORAL | Status: AC | PRN
  Administered 2019-02-18: 650 mg via ORAL
  Filled 2019-02-18: qty 2

## 2019-02-18 NOTE — ED Notes (Addendum)
Pt called for assistance. States "I feel cold and I dont feel right". Assessed pts vitals Hr100, bo 136/84, resp 35, spo2 100. Placed patient on 2 L o2, raised HOB to high fowlers, assessed pts Iv fluids still running. Provider called to bedside. Will continue to monitor

## 2019-02-18 NOTE — ED Notes (Signed)
Pt states " I feel better". Hr 94, resp 22, spo2 100. Will continue to monitor

## 2019-02-18 NOTE — Discharge Instructions (Addendum)
Please read instructions below. Take tylenol every 4-6 hours for fever.  Drink clear liquids until your stomach feels better. Then, slowly introduce bland foods into your diet as tolerated, such as bread, rice, apples, bananas. You can take zofran every 8 hours as needed for nausea. You have a COVID test pending. Please isolate at home while awaiting your result. You can resume normal activity if your result is negative and once your symptoms resolve. Follow up with your primary care provider regarding your visit today. Return to the ER for severe abdominal pain, uncontrollable vomiting, or new or concerning symptoms.     Person Under Monitoring Name: Yvonne [name]  Location: [address] [place] [address]   Infection Prevention Recommendations for Individuals Confirmed to have, or Being Evaluated for, 2019 Novel Coronavirus (COVID-19) Infection Who Receive Care at Home  Individuals who are confirmed to have, or are being evaluated for, COVID-19 should follow the prevention steps below until a healthcare provider or local or state health department says they can return to normal activities.  Stay home except to get medical care You should restrict activities outside your home, except for getting medical care. Do not go to work, school, or public areas, and do not use public transportation or taxis.  Call ahead before visiting your doctor Before your medical appointment, call the healthcare provider and tell them that you have, or are being evaluated for, COVID-19 infection. This will help the healthcare providers office take steps to keep other people from getting infected. Ask your healthcare provider to call the local or state health department.  Monitor your symptoms Seek prompt medical attention if your illness is worsening (e.g., difficulty breathing). Before going to your medical appointment, call the healthcare provider and tell them that you have, or are being  evaluated for, COVID-19 infection. Ask your healthcare provider to call the local or state health department.  Wear a facemask You should wear a facemask that covers your nose and mouth when you are in the same room with other people and when you visit a healthcare provider. People who live with or visit you should also wear a facemask while they are in the same room with you.  Separate yourself from other people in your home As much as possible, you should stay in a different room from other people in your home. Also, you should use a separate bathroom, if available.  Avoid sharing household items You should not share dishes, drinking glasses, cups, eating utensils, towels, bedding, or other items with other people in your home. After using these items, you should wash them thoroughly with soap and water.  Cover your coughs and sneezes Cover your mouth and nose with a tissue when you cough or sneeze, or you can cough or sneeze into your sleeve. Throw used tissues in a lined trash can, and immediately wash your hands with soap and water for at least 20 seconds or use an alcohol-based hand rub.  Wash your [facility] your hands often and thoroughly with soap and water for at least 20 seconds. You can use an alcohol-based hand sanitizer if soap and water are not available and if your hands are not visibly dirty. Avoid touching your eyes, nose, and mouth with unwashed hands.   Prevention Steps for Caregivers and Household Members of Individuals Confirmed to have, or Being Evaluated for, COVID-19 Infection Being Cared for in the Home  If you live with, or provide care at home for, a person confirmed to  have, or being evaluated for, COVID-19 infection please follow these guidelines to prevent infection:  Follow healthcare providers instructions Make sure that you understand and can help the patient follow any healthcare provider instructions for all care.  Provide for the patients  basic needs You should help the patient with basic needs in the home and provide support for getting groceries, prescriptions, and other personal needs.  Monitor the patients symptoms If they are getting sicker, call his or her medical provider and tell them that the patient has, or is being evaluated for, COVID-19 infection. This will help the healthcare providers office take steps to keep other people from getting infected. Ask the healthcare provider to call the local or state health department.  Limit the number of people who have contact with the patient If possible, have only one caregiver for the patient. Other household members should stay in another home or place of residence. If this is not possible, they should stay in another room, or be separated from the patient as much as possible. Use a separate bathroom, if available. Restrict visitors who do not have an essential need to be in the home.  Keep older adults, very young children, and other sick people away from the patient Keep older adults, very young children, and those who have compromised immune systems or chronic health conditions away from the patient. This includes people with chronic heart, lung, or kidney conditions, diabetes, and cancer.  Ensure good ventilation Make sure that shared spaces in the home have good air flow, such as from an air conditioner or an opened window, weather permitting.  Wash your hands often Wash your hands often and thoroughly with soap and water for at least 20 seconds. You can use an alcohol based hand sanitizer if soap and water are not available and if your hands are not visibly dirty. Avoid touching your eyes, nose, and mouth with unwashed hands. Use disposable paper towels to dry your hands. If not available, use dedicated cloth towels and replace them when they become wet.  Wear a facemask and gloves Wear a disposable facemask at all times in the room and gloves when you touch or  have contact with the patients blood, body fluids, and/or secretions or excretions, such as sweat, saliva, sputum, nasal mucus, vomit, urine, or feces.  Ensure the mask fits over your nose and mouth tightly, and do not touch it during use. Throw out disposable facemasks and gloves after using them. Do not reuse. Wash your hands immediately after removing your facemask and gloves. If your personal clothing becomes contaminated, carefully remove clothing and launder. Wash your hands after handling contaminated clothing. Place all used disposable facemasks, gloves, and other waste in a lined container before disposing them with other household waste. Remove gloves and wash your hands immediately after handling these items.  Do not share dishes, glasses, or other household items with the patient Avoid sharing household items. You should not share dishes, drinking glasses, cups, eating utensils, towels, bedding, or other items with a patient who is confirmed to have, or being evaluated for, COVID-19 infection. After the person uses these items, you should wash them thoroughly with soap and water.  Wash laundry thoroughly Immediately remove and wash clothes or bedding that have blood, body fluids, and/or secretions or excretions, such as sweat, saliva, sputum, nasal mucus, vomit, urine, or feces, on them. Wear gloves when handling laundry from the patient. Read and follow directions on labels of laundry or clothing items and  detergent. In general, wash and dry with the warmest temperatures recommended on the label.  Clean all areas the individual has used often Clean all touchable surfaces, such as counters, tabletops, doorknobs, bathroom fixtures, toilets, phones, keyboards, tablets, and bedside tables, every day. Also, clean any surfaces that may have blood, body fluids, and/or secretions or excretions on them. Wear gloves when cleaning surfaces the patient has come in contact with. Use a diluted  bleach solution (e.g., dilute bleach with 1 part bleach and 10 parts water) or a household disinfectant with a label that says EPA-registered for coronaviruses. To make a bleach solution at home, add 1 tablespoon of bleach to 1 quart (4 cups) of water. For a larger supply, add  cup of bleach to 1 gallon (16 cups) of water. Read labels of cleaning products and follow recommendations provided on product labels. Labels contain instructions for safe and effective use of the cleaning product including precautions you should take when applying the product, such as wearing gloves or eye protection and making sure you have good ventilation during use of the product. Remove gloves and wash hands immediately after cleaning.  Monitor yourself for signs and symptoms of illness Caregivers and household members are considered close contacts, should monitor their health, and will be asked to limit movement outside of the home to the extent possible. Follow the monitoring steps for close contacts listed on the symptom monitoring form.   ? If you have additional questions, contact your local health department or call the epidemiologist on call at [phone] (available 24/7). ? This guidance is subject to change. For the most up-to-date guidance from [facility], please refer to their website: TripMetro.huhttps://www.cdc.gov/coronavirus/2019-ncov/hcp/guidance-prevent-spread.html

## 2019-02-18 NOTE — ED Triage Notes (Signed)
Pt states that she has had emesis and diarrhea x 2 days. Pt states that she has had 4 episodes of emesis in the last 24 hours, and "too many to count" episodes of diarrhea. Pt states that the pain radiates from her groin bilaterally to her back.

## 2019-02-18 NOTE — ED Provider Notes (Signed)
[facility]-EMERGENCY DEPT Provider Note   CSN: [id number] Arrival date & time: 02/18/19  1417     History   Chief Complaint Chief Complaint  Patient presents with  . Emesis  . Diarrhea    HPI Yvonne [name] is a 28 y.o. female presenting to the emergency department with complaint of nausea, vomiting and diarrhea that began last night.  Patient states she felt well throughout the day, however as she got home from work she began having vomiting.  She has had episodes of vomiting and diarrhea since last night and persisting throughout the day.  She reports associated body aches which radiate towards her abdomen though no significant abdominal pain is reported.  Denies urinary symptoms or pelvic complaints.  She was unaware she had a fever until arrival here to the ED.  Treated her symptoms with Pepto-Bismol without relief. She states her son was also feeling unwell yesterday though he had a cough and is afebrile without abdominal complaints.  No known contact with Covid positive people.  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     The history is provided by the patient.    Past Medical History:  Diagnosis Date  . ADHD (attention deficit hyperactivity disorder)    off meds since 2011  . [facility]lam[facility] 02/2012   [facility]  . Vitamin D deficiency     Patient Active Problem List   Diagnosis Date Noted  . S/P primary low transverse C-section 12/29/2016  . High risk sexual behavior 01/14/2016  . Healthcare maintenance 01/14/2016  . Vitamin D deficiency 06/29/2015    Past Surgical History:  Procedure Laterality Date  . CESAREAN SECTION N/A 12/29/2016   Procedure: CESAREAN SECTION;  Surgeon: Huel Coteichardson, Kathy, MD;  [facility];  Service: Obstetrics;  Laterality: N/A;     OB History    Gravida  1   Para  1   Term  1   Preterm  0   AB  0   Living  1     SAB  0   TAB  0   Ectopic  0   Multiple  0   Live  Births  1            Home Medications    Prior to Admission medications   Medication Sig Start Date End Date Taking? Authorizing Provider  ergocalciferol (VITAMIN D2) 1.25 MG (50000 UT) capsule Take 1 capsule (50,000 Units total) by mouth once a week. 03/11/18   Joselyn ArrowKnapp, Eve, MD  levonorgestrel (MIRENA) 20 MCG/24HR IUD 1 each by Intrauterine route once.    [provider]  ondansetron (ZOFRAN ODT) 4 MG disintegrating tablet Take 1 tablet (4 mg total) by mouth every 8 (eight) hours as needed for nausea or vomiting. 02/18/19   Taniyah Ballow, [place] N, PA-C    Family History Family History  Problem Relation Age of Onset  . Hypertension Mother   . Diabetes Father   . Hypertension Father   . Cancer Father        leukemia in his 6250's  . Cancer Paternal Aunt        breast cancer  . Drug abuse Paternal Uncle     Social History Social History   Tobacco Use  . Smoking status: Never Smoker  . Smokeless tobacco: Never Used  Substance Use Topics  . Alcohol use: Yes    Alcohol/week: 0.0 standard drinks    Frequency: Never    Comment: occasional (1-2  month)  . Drug use: No     Allergies   Patient has no known allergies.   Review of Systems Review of Systems  All other systems reviewed and are negative.    Physical Exam Updated Vital Signs BP 109/68   Pulse 100   Temp 99.5 F (37.5 C) (Oral)   Resp 16   Wt 65 kg   SpO2 99%   BMI 27.08 kg/m   Physical Exam Vitals signs and nursing note reviewed.  Constitutional:      General: She is not in acute distress.    Appearance: She is well-developed. She is not ill-appearing.     Comments: Patient is well-appearing and in no distress.  HENT:     Head: Normocephalic and atraumatic.  Eyes:     Conjunctiva/sclera: Conjunctivae normal.  Cardiovascular:     Rate and Rhythm: Regular rhythm. Tachycardia present.  Pulmonary:     Effort: Pulmonary effort is normal. No respiratory distress.     Breath sounds: Normal  breath sounds.  Abdominal:     General: Bowel sounds are normal.     Palpations: Abdomen is soft.     Tenderness: There is no abdominal tenderness. There is no guarding or rebound.  Skin:    General: Skin is warm.  Neurological:     Mental Status: She is alert.  Psychiatric:        Behavior: Behavior normal.      ED Treatments / Results  Labs (all labs ordered are listed, but only abnormal results are displayed) Labs Reviewed  COMPREHENSIVE METABOLIC PANEL - Abnormal; Notable for the following components:      Result Value   Glucose, Bld 124 (*)    Calcium 8.8 (*)    All other components within normal limits  CBC - Abnormal; Notable for the following components:   RBC 5.14 (*)    All other components within normal limits  URINALYSIS, ROUTINE W REFLEX MICROSCOPIC - Abnormal; Notable for the following components:   Specific Gravity, Urine 1.032 (*)    Ketones, ur 20 (*)    Protein, ur 30 (*)    All other components within normal limits  SARS CORONAVIRUS 2 (TAT 6-24 HRS)  LIPASE, BLOOD  LACTIC ACID, PLASMA  I-STAT BETA HCG BLOOD, ED ([facility], [facility], [facility] ONLY)  I-STAT BETA HCG BLOOD, ED (NOT ORDERABLE)    EKG None  Radiology Dg Chest 2 View  Result Date: 02/18/2019 CLINICAL DATA:  Emesis and diarrhea EXAM: CHEST - 2 VIEW COMPARISON:  None. FINDINGS: Lungs are clear. Heart size and pulmonary vascularity are normal. No adenopathy. There is midthoracic dextroscoliosis. IMPRESSION: No edema or consolidation. Electronically Signed   By: Lowella Grip III M.D.   On: 02/18/2019 14:44    Procedures Procedures (including critical care time)  Medications Ordered in ED Medications  sodium chloride flush (NS) 0.9 % injection 3 mL (has no administration in time range)  ondansetron (ZOFRAN) injection 4 mg (has no administration in time range)  acetaminophen (TYLENOL) tablet 650 mg (650 mg Oral Given 02/18/19 1433)  ondansetron (ZOFRAN) injection 4 mg (4 mg Intravenous Given 02/18/19  1628)  sodium chloride 0.9 % bolus 1,000 mL (1,000 mLs Intravenous New Bag/Given (Non-Interop) 02/18/19 1631)     Initial Impression / Assessment and Plan / ED Course  I have reviewed the triage vital signs and the nursing notes.  Pertinent labs & imaging results that were available during my care of the patient were reviewed by me and  considered in my medical decision making (see chart for details).  Clinical Course as of Feb 17 1937  Fri Feb 18, 2019  1803 Pt re-evaluated and is feeling better. She states she is hungry and requesting food. Will finished IVF and discharge with symptomatic management   [JR]    Clinical Course User Index [JR] Rondarius Kadrmas, [place] N, PA-C      Patient presenting with nausea, vomiting, and diarrhea that began yesterday.  She is noted to have fever on arrival at 101.31F.  On exam she is overall well-appearing, in no distress, with a soft and nontender abdomen.  Labs and chest x-ray obtained in triage are very reassuring.  There is no leukocytosis or electrolyte derangement.  Chest x-ray is negative.  She is treated with Tylenol and IV fluids with significant improvement in vital signs.  Zofran improved nausea and she is tolerating p.o.  While patient was prepared for discharge, she complained of tingling sensation in her hands and feet as well as feeling cold, and I was called to bedside.  She appeared anxious though symptoms quickly resolved.  Patient monitored some time longer without recurrence of symptoms and improvement back to baseline.  Will discharge with symptomatic management for likely viral illness including Zofran, and clear fluids with advance diet as tolerated.  Covid test was sent.  Patient is agreeable to plan and safe for discharge.  Discussed results, findings, treatment and follow up. Patient advised of return precautions. Patient verbalized understanding and agreed with plan.  Final Clinical Impressions(s) / ED Diagnoses   Final diagnoses:   Nausea vomiting and diarrhea    ED Discharge Orders         Ordered    ondansetron (ZOFRAN ODT) 4 MG disintegrating tablet  Every 8 hours PRN     02/18/19 1936           Siobhan Zaro, [place] N, PA-C 02/18/19 1939    Terald Sleeper, MD 02/19/19 1151

## 2019-02-18 NOTE — ED Notes (Signed)
Pt given some gingerale for PO challenge. Will continue to monitor

## 2019-02-19 LAB — SARS CORONAVIRUS 2 (TAT 6-24 HRS): SARS Coronavirus 2: NEGATIVE

## 2019-03-15 NOTE — Patient Instructions (Addendum)
HEALTH MAINTENANCE RECOMMENDATIONS:  It is recommended that you get at least 30 minutes of aerobic exercise at least 5 days/week (for weight loss, you may need as much as 60-90 minutes). This can be any activity that gets your heart rate up. This can be divided in 10-15 minute intervals if needed, but try and build up your endurance at least once a week.  Weight bearing exercise is also recommended twice weekly.  Eat a healthy diet with lots of vegetables, fruits and fiber.  "Colorful" foods have a lot of vitamins (ie green vegetables, tomatoes, red peppers, etc).  Limit sweet tea, regular sodas and alcoholic beverages, all of which has a lot of calories and sugar.  Up to 1 alcoholic drink daily may be beneficial for women (unless trying to lose weight, watch sugars).  Drink a lot of water.  Calcium recommendations are 1200-1500 mg daily (1500 mg for postmenopausal women or women without ovaries), and vitamin D 1000 IU daily.  This should be obtained from diet and/or supplements (vitamins), and calcium should not be taken all at once, but in divided doses.  Monthly self breast exams and yearly mammograms for women over the age of 54 is recommended.  Sunscreen of at least SPF 30 should be used on all sun-exposed parts of the skin when outside between the hours of 10 am and 4 pm (not just when at beach or pool, but even with exercise, golf, tennis, and yard work!)  Use a sunscreen that says "broad spectrum" so it covers both UVA and UVB rays, and make sure to reapply every 1-2 hours.  Remember to change the batteries in your smoke detectors when changing your clock times in the spring and fall. Carbon monoxide detectors are recommended for your home.  Use your seat belt every time you are in a car, and please drive safely and not be distracted with cell phones and texting while driving.  I suspect your vitamin D level will again be very low, requiring another course of prescription replacement (once  weekly prescription).  When you go to pick this up from the pharmacy, please ALSO pick up a large bottle of D3 2000 IU, and start to take this vitamin daily after you take the last prescription capsule.  Please use condoms regularly to prevent sexually transmitted diseases. Your IUD strings weren't visible on today's exam.  We will be contacting you later with appointment to further evaluate (ultrasound), to ensure that it is in the proper place and hasn't migrated.  Your ears are being cleaned out today.  Please be sure NOT to use Q-tips or put anything in your ears which can push the wax in further.   Earwax Buildup, Adult The ears produce a substance called earwax that helps keep bacteria out of the ear and protects the skin in the ear canal. Occasionally, earwax can build up in the ear and cause discomfort or hearing loss. What increases the risk? This condition is more likely to develop in people who:  Are female.  Are elderly.  Naturally produce more earwax.  Clean their ears often with cotton swabs.  Use earplugs often.  Use in-ear headphones often.  Wear hearing aids.  Have narrow ear canals.  Have earwax that is overly thick or sticky.  Have eczema.  Are dehydrated.  Have excess hair in the ear canal. What are the signs or symptoms? Symptoms of this condition include:  Reduced or muffled hearing.  A feeling of fullness in the ear  or feeling that the ear is plugged.  Fluid coming from the ear.  Ear pain.  Ear itch.  Ringing in the ear.  Coughing.  An obvious piece of earwax that can be seen inside the ear canal. How is this diagnosed? This condition may be diagnosed based on:  Your symptoms.  Your medical history.  An ear exam. During the exam, your health care provider will look into your ear with an instrument called an otoscope. You may have tests, including a hearing test. How is this treated? This condition may be treated by:  Using ear  drops to soften the earwax.  Having the earwax removed by a health care provider. The health care provider may: ? Flush the ear with water. ? Use an instrument that has a loop on the end (curette). ? Use a suction device.  Surgery to remove the wax buildup. This may be done in severe cases. Follow these instructions at home:   Take over-the-counter and prescription medicines only as told by your health care provider.  Do not put any objects, including cotton swabs, into your ear. You can clean the opening of your ear canal with a washcloth or facial tissue.  Follow instructions from your health care provider about cleaning your ears. Do not over-clean your ears.  Drink enough fluid to keep your urine clear or pale yellow. This will help to thin the earwax.  Keep all follow-up visits as told by your health care provider. If earwax builds up in your ears often or if you use hearing aids, consider seeing your health care provider for routine, preventive ear cleanings. Ask your health care provider how often you should schedule your cleanings.  If you have hearing aids, clean them according to instructions from the manufacturer and your health care provider. Contact a health care provider if:  You have ear pain.  You develop a fever.  You have blood, pus, or other fluid coming from your ear.  You have hearing loss.  You have ringing in your ears that does not go away.  Your symptoms do not improve with treatment.  You feel like the room is spinning (vertigo). Summary  Earwax can build up in the ear and cause discomfort or hearing loss.  The most common symptoms of this condition include reduced or muffled hearing and a feeling of fullness in the ear or feeling that the ear is plugged.  This condition may be diagnosed based on your symptoms, your medical history, and an ear exam.  This condition may be treated by using ear drops to soften the earwax or by having the earwax  removed by a health care provider.  Do not put any objects, including cotton swabs, into your ear. You can clean the opening of your ear canal with a washcloth or facial tissue. This information is not intended to replace advice given to you by your health care provider. Make sure you discuss any questions you have with your health care provider. Document Released: 05/22/2004 Document Revised: 03/27/2017 Document Reviewed: 06/25/2016 Elsevier Patient Education  2020 [employer].
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
## 2019-03-16 ENCOUNTER — Other Ambulatory Visit: Payer: Self-pay

## 2019-03-16 ENCOUNTER — Encounter: Payer: Self-pay | Admitting: Family Medicine

## 2019-03-16 ENCOUNTER — Ambulatory Visit (INDEPENDENT_AMBULATORY_CARE_PROVIDER_SITE_OTHER): Payer: BC Managed Care – PPO | Admitting: Family Medicine

## 2019-03-16 ENCOUNTER — Ambulatory Visit
Admission: RE | Admit: 2019-03-16 | Discharge: 2019-03-16 | Disposition: A | Discharge status: Home / Self Care | Payer: BC Managed Care – PPO | Source: Ambulatory Visit | Attending: Family Medicine | Admitting: Family Medicine

## 2019-03-16 VITALS — BP 106/70 | HR 76 | Temp 96.0°F | Ht 62.0 in | Wt 145.6 lb

## 2019-03-16 DIAGNOSIS — Z975 Presence of (intrauterine) contraceptive device: Secondary | ICD-10-CM

## 2019-03-16 DIAGNOSIS — E559 Vitamin D deficiency, unspecified: Secondary | ICD-10-CM

## 2019-03-16 DIAGNOSIS — H6123 Impacted cerumen, bilateral: Secondary | ICD-10-CM

## 2019-03-16 DIAGNOSIS — Z0000 Encounter for general adult medical examination without abnormal findings: Secondary | ICD-10-CM | POA: Diagnosis not present

## 2019-03-16 DIAGNOSIS — Z23 Encounter for immunization: Secondary | ICD-10-CM | POA: Diagnosis not present

## 2019-03-16 DIAGNOSIS — Z30430 Encounter for insertion of intrauterine contraceptive device: Secondary | ICD-10-CM | POA: Diagnosis not present

## 2019-03-16 DIAGNOSIS — Z113 Encounter for screening for infections with a predominantly sexual mode of transmission: Secondary | ICD-10-CM | POA: Diagnosis not present

## 2019-03-16 LAB — POCT URINALYSIS DIP (PROADVANTAGE DEVICE)
Bilirubin, UA: NEGATIVE
Blood, UA: NEGATIVE
Glucose, UA: NEGATIVE mg/dL
Ketones, POC UA: NEGATIVE mg/dL
Leukocytes, UA: NEGATIVE
Nitrite, UA: NEGATIVE
Protein Ur, POC: NEGATIVE mg/dL
Specific Gravity, Urine: 1.025
Urobilinogen, Ur: NEGATIVE
pH, UA: 6 (ref 5.0–8.0)

## 2019-03-17 LAB — RPR: RPR Ser Ql: NONREACTIVE

## 2019-03-17 LAB — VITAMIN D 25 HYDROXY (VIT D DEFICIENCY, FRACTURES): Vit D, 25-Hydroxy: 12.7 ng/mL — ABNORMAL LOW (ref 30.0–100.0)

## 2019-03-17 LAB — HIV ANTIBODY (ROUTINE TESTING W REFLEX): HIV Screen 4th Generation wRfx: NONREACTIVE

## 2019-03-17 LAB — GLUCOSE, RANDOM: Glucose: 85 mg/dL (ref 65–99)

## 2019-03-17 MED ORDER — ERGOCALCIFEROL 1.25 MG (50000 UT) PO CAPS: 50000.0000 [IU] | ORAL_CAPSULE | ORAL | 0 refills | Status: DC

## 2019-03-17 NOTE — Addendum Note (Signed)
Addended by: Rita Ohara on: 03/17/2019 07:26 AM   Modules accepted: Orders

## 2019-03-20 LAB — GC/CHLAMYDIA PROBE AMP
Chlamydia trachomatis, NAA: NEGATIVE
Neisseria Gonorrhoeae by PCR: NEGATIVE

## 2019-04-18 DIAGNOSIS — Z03818 Encounter for observation for suspected exposure to other biological agents ruled out: Secondary | ICD-10-CM | POA: Diagnosis not present

## 2019-06-10 ENCOUNTER — Other Ambulatory Visit: Payer: Self-pay | Admitting: Family Medicine

## 2019-06-10 DIAGNOSIS — E559 Vitamin D deficiency, unspecified: Secondary | ICD-10-CM

## 2019-06-10 NOTE — Telephone Encounter (Signed)
Per lab results. Once finished take otc

## 2019-06-17 DIAGNOSIS — H40013 Open angle with borderline findings, low risk, bilateral: Secondary | ICD-10-CM | POA: Diagnosis not present

## 2020-02-03 ENCOUNTER — Telehealth (INDEPENDENT_AMBULATORY_CARE_PROVIDER_SITE_OTHER): Payer: Self-pay | Admitting: Medical

## 2020-02-03 ENCOUNTER — Encounter: Payer: Self-pay | Admitting: Medical

## 2020-02-03 ENCOUNTER — Other Ambulatory Visit: Payer: Self-pay

## 2020-02-03 VITALS — Temp 98.0°F | Wt 140.0 lb

## 2020-02-03 DIAGNOSIS — R519 Headache, unspecified: Secondary | ICD-10-CM

## 2020-02-03 DIAGNOSIS — J029 Acute pharyngitis, unspecified: Secondary | ICD-10-CM

## 2020-02-03 DIAGNOSIS — Z20818 Contact with and (suspected) exposure to other bacterial communicable diseases: Secondary | ICD-10-CM

## 2020-02-03 DIAGNOSIS — R059 Cough, unspecified: Secondary | ICD-10-CM

## 2020-02-03 MED ORDER — AMOXICILLIN 500 MG PO CAPS: 500.0000 mg | ORAL_CAPSULE | Freq: Three times a day (TID) | ORAL | 0 refills | Status: DC

## 2020-02-03 NOTE — Progress Notes (Addendum)
Virtual telephone visit    Virtual Visit via Telephone Note   This visit type was conducted due to national recommendations for restrictions regarding the COVID-19 Pandemic (e.g. social distancing) in an effort to limit this patient's exposure and mitigate transmission in our community. Due to her co-morbid illnesses, this patient is at least at moderate risk for complications without adequate follow up. This format is felt to be most appropriate for this patient at this time. The patient did not have access to video technology or had technical difficulties with video requiring transitioning to audio format only (telephone). Physical exam was limited to content and character of the telephone conversation.    Patient location: Home Provider location: Office  I discussed the limitations of evaluation and management by telemedicine and the availability of in person appointments. The patient expressed understanding and agreed to proceed.   Visit Date: 02/03/2020  Today's healthcare provider: Kristian Covey, PA-C   Chief Complaint  Patient presents with  . ther    ST, son tested positive for strep throat, cough, started couple of days ago    Subjective    HPI HPI    ther     Additional comments: ST, son tested positive for strep throat, cough, started couple of days ago       Last edited by Fredirick Maudlin, RMA on 02/03/2020  8:02 AM. (History)      Virtual consult today for sore throat.  Her 3-year-old son tested positive for strep throat yesterday at his pediatrician office.  Her son has had strep throat a few times in the last few months.  He tested negative for Covid yesterday she notes symptoms in the last 24 hours including sore throat, headache on and off, slight cough but no other symptoms.  She denies loss of smell or taste, sinus pressure, shortness of breath, fever, nausea, vomiting, diarrhea.No other aggravating or relieving factors. No other  complaint.    Medications: Outpatient Medications Prior to Visit  Medication Sig  . ergocalciferol (VITAMIN D2) 1.25 MG (50000 UT) capsule Take 1 capsule (50,000 Units total) by mouth once a week.  Marland Kitchen levonorgestrel (MIRENA) 20 MCG/24HR IUD 1 each by Intrauterine route once.  . ondansetron (ZOFRAN ODT) 4 MG disintegrating tablet Take 1 tablet (4 mg total) by mouth every 8 (eight) hours as needed for nausea or vomiting. (Patient not taking: Reported on 03/16/2019)   No facility-administered medications prior to visit.    Review of Systems As in subjective   Objective    Temp 98 F (36.7 C)   Wt 140 lb (63.5 kg)   BMI 25.61 kg/m  General well-developed well-nourished no acute distress otherwise not examined as this is a virtual consult    Assessment & Plan   Encounter Diagnoses  Name Primary?  . Streptococcus exposure Yes  . Sore throat   . Cough   . Nonintractable headache, unspecified chronicity pattern, unspecified headache type        We discussed symptoms and concerns, positive strep exposure.  She will begin amoxicillin as below.  Discussed supportive care including warm fluids, Tylenol, sore throat spray, rest, salt water gargles.  I also gave her the option to come in for Covid testing just to rule that out since we see overlap sometimes with strep positive and she has cough.  She will consider.  I discussed the assessment and treatment plan with the patient. The patient was provided an opportunity to ask questions and all were answered.  The patient agreed with the plan and demonstrated an understanding of the instructions.   The patient was advised to call back or seek an in-person evaluation if the symptoms worsen or if the condition fails to improve as anticipated.  I spent approximate 15 minutes with patient directly with this virtual consult   Cathren was seen today for ther.  Diagnoses and all orders for this visit:  Streptococcus exposure  Sore  throat  Cough  Nonintractable headache, unspecified chronicity pattern, unspecified headache type  Other orders -     amoxicillin (AMOXIL) 500 MG capsule; Take 1 capsule (500 mg total) by mouth 3 (three) times daily.   Follow-up as needed
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
## 2020-04-04 NOTE — Patient Instructions (Incomplete)

## 2020-04-05 ENCOUNTER — Telehealth: Payer: Self-pay | Admitting: *Deleted

## 2020-04-05 ENCOUNTER — Encounter: Payer: BC Managed Care – PPO | Admitting: Family Medicine

## 2020-04-05 DIAGNOSIS — Z0000 Encounter for general adult medical examination without abnormal findings: Secondary | ICD-10-CM

## 2020-04-05 DIAGNOSIS — E559 Vitamin D deficiency, unspecified: Secondary | ICD-10-CM

## 2020-04-05 NOTE — Telephone Encounter (Signed)
No show letter and charge.

## 2020-04-05 NOTE — Telephone Encounter (Signed)

## 2020-04-09 ENCOUNTER — Encounter: Payer: Self-pay | Admitting: Family Medicine

## 2020-06-13 ENCOUNTER — Telehealth: Payer: Self-pay | Admitting: Family Medicine

## 2020-06-13 NOTE — Telephone Encounter (Signed)
Dismissal letter in guarantor snapshot  °

## 2020-08-06 IMAGING — CR DG CHEST 2V
2 series · 2 of 2 positions shown · non-contrast
Comparison: None.

CLINICAL DATA: Emesis and diarrhea

EXAM:
CHEST - 2 VIEW

[w chest pa]
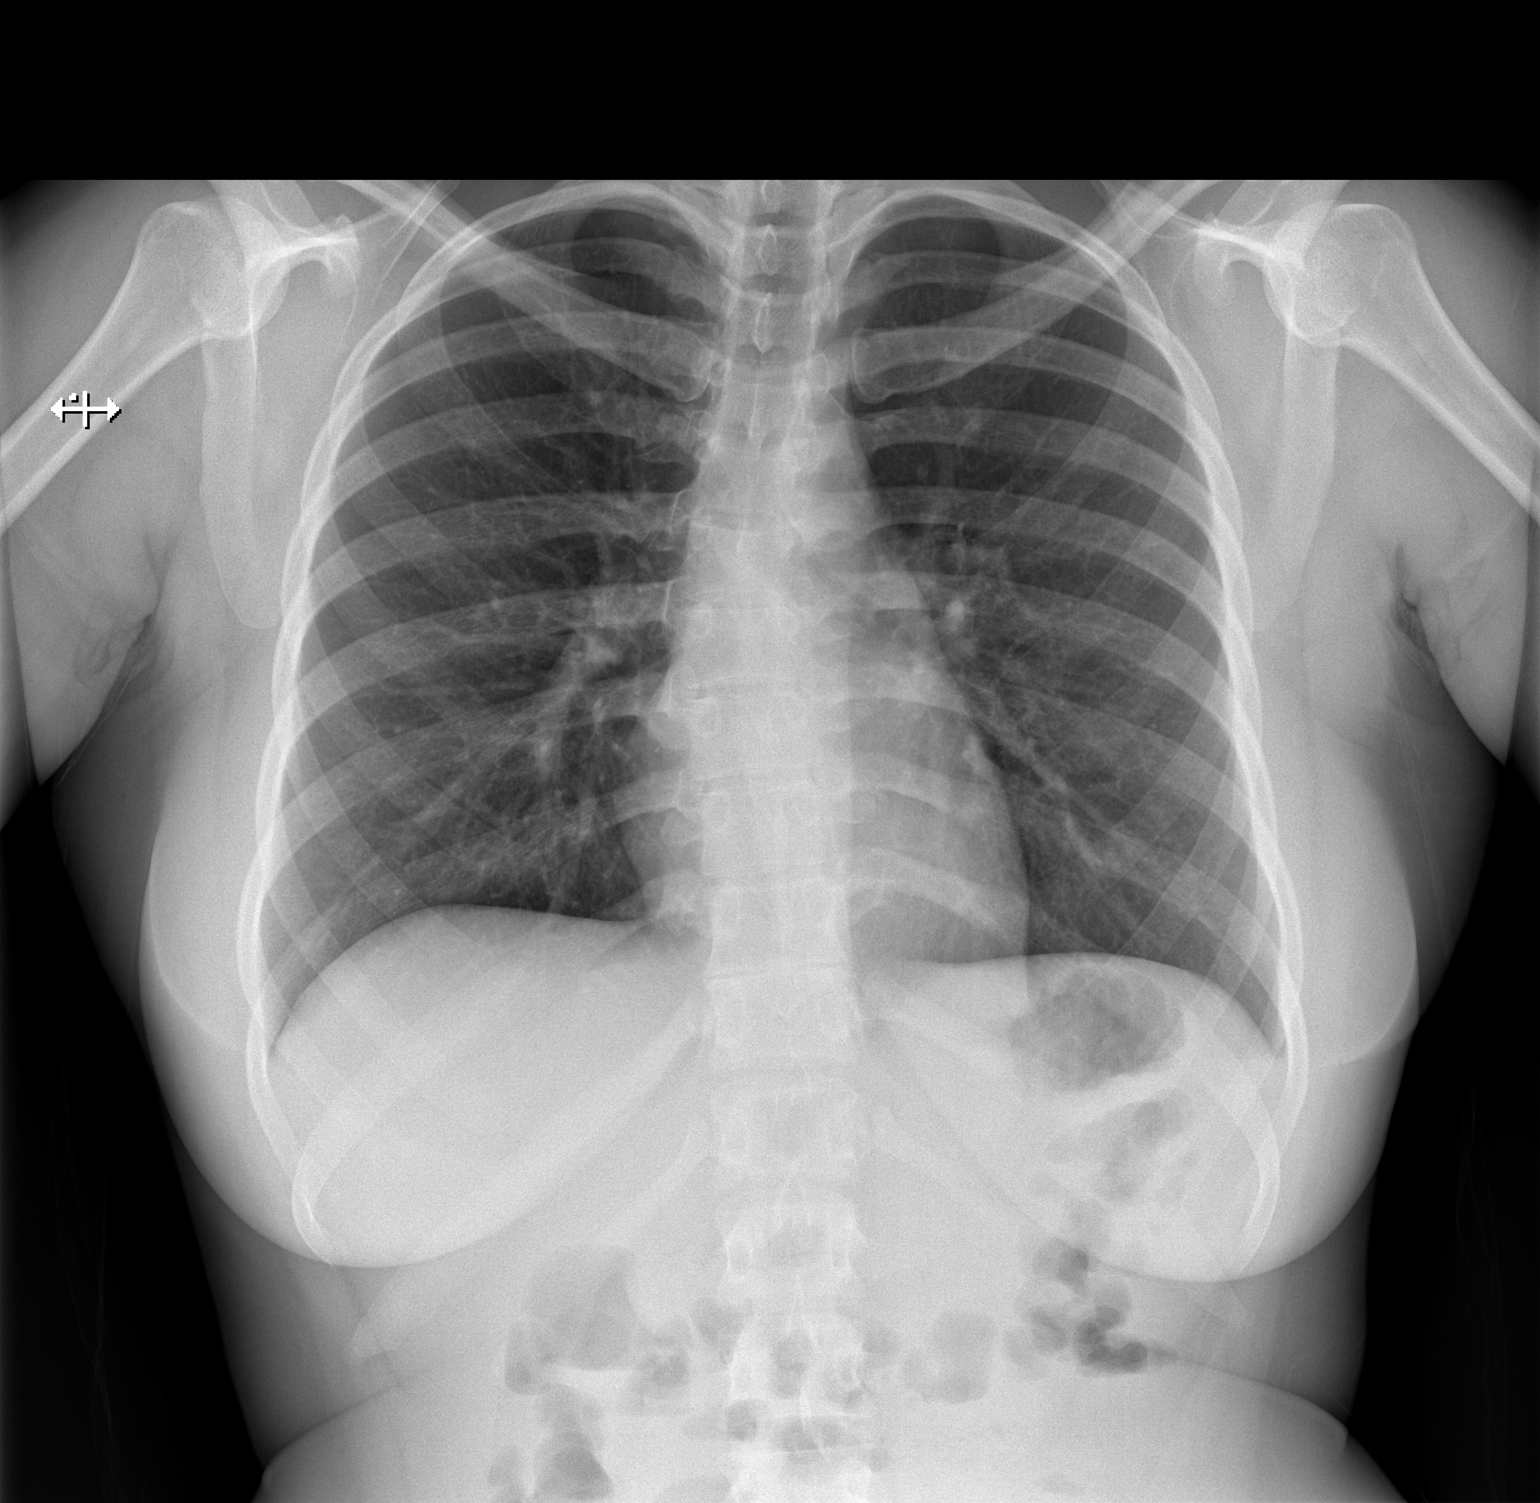

[w chest lat]
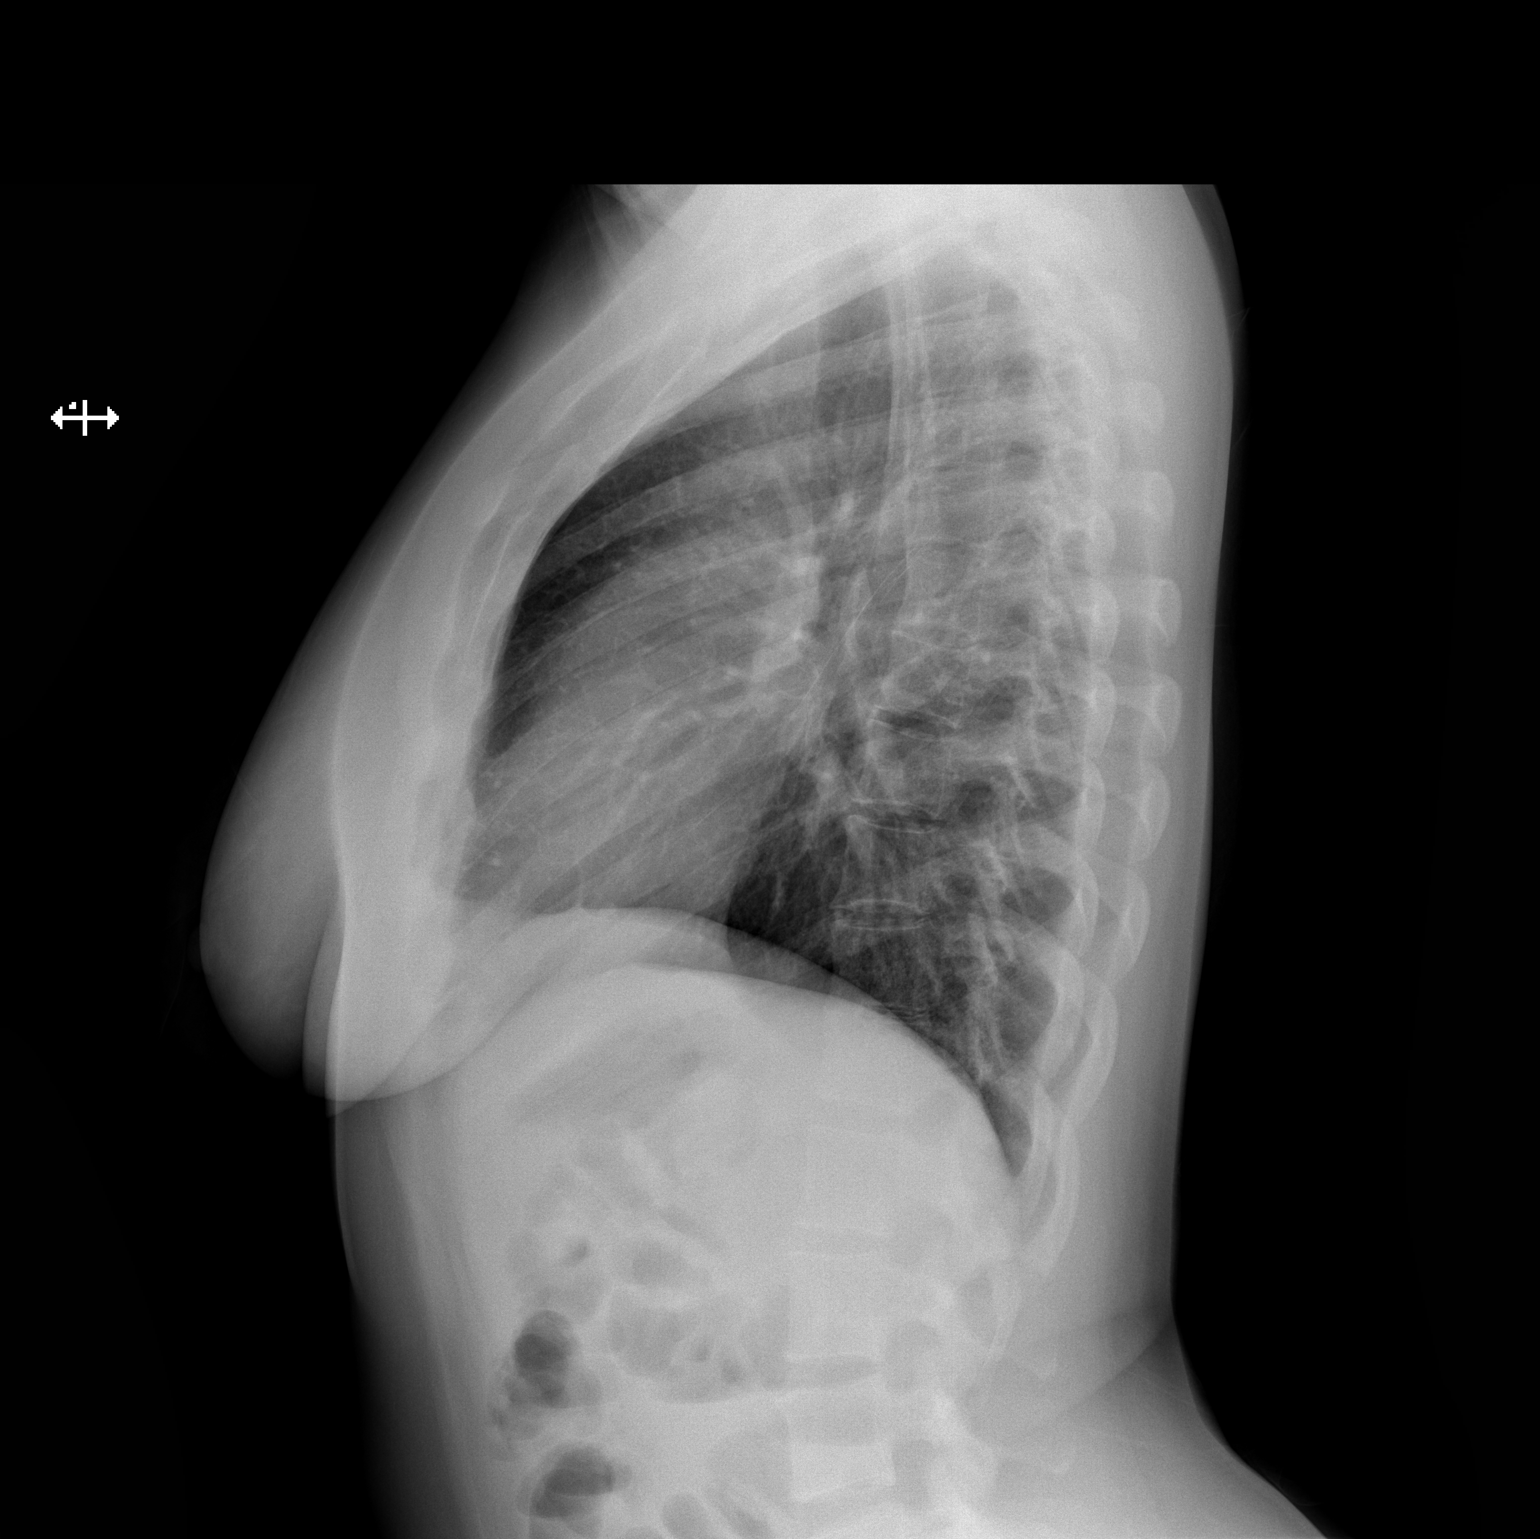

[2 of 2 positions shown; findings below may reference images not displayed]

FINDINGS: Lungs are clear. Heart size and pulmonary vascularity are normal. No
adenopathy. There is midthoracic dextroscoliosis.
IMPRESSION: No edema or consolidation.

## 2020-09-01 IMAGING — US US PELVIS COMPLETE WITH TRANSVAGINAL
1 series · 14 of 25 positions shown · non-contrast
Comparison: None

CLINICAL DATA: IUD position

EXAM:
TRANSABDOMINAL AND TRANSVAGINAL ULTRASOUND OF PELVIS
TECHNIQUE: Both transabdominal and transvaginal ultrasound examinations of the
pelvis were performed. Transabdominal technique was performed for
global imaging of the pelvis including uterus, ovaries, adnexal
regions, and pelvic cul-de-sac. It was necessary to proceed with
endovaginal exam following the transabdominal exam to visualize the
uterus endometrium ovaries.

[Series 1: us pelvis complete with transvaginal · 0.19mm/px · 73 acquisitions, 14 frames shown]
[im 1/73]
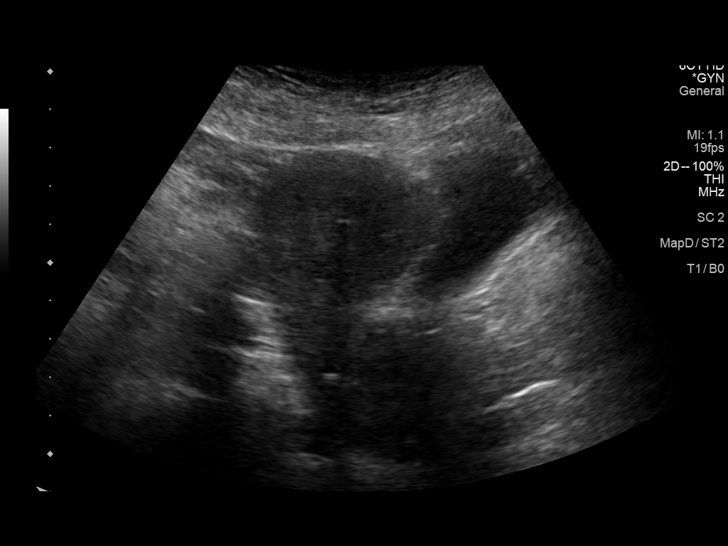
[im 7/73]
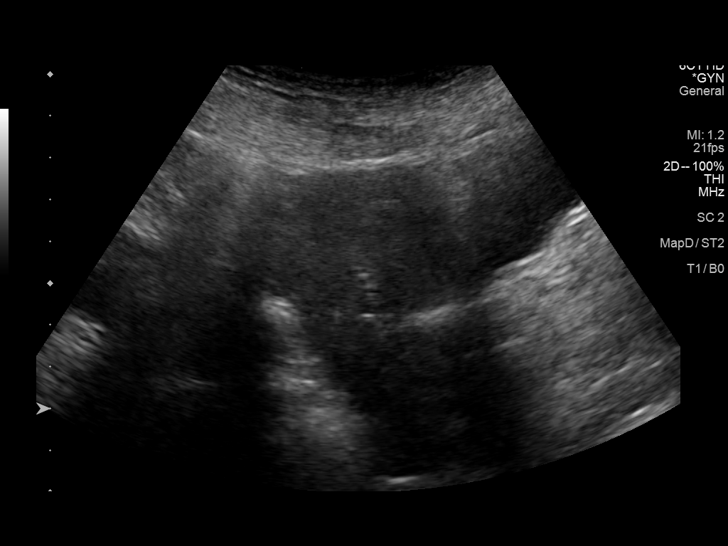
[im 13/73]
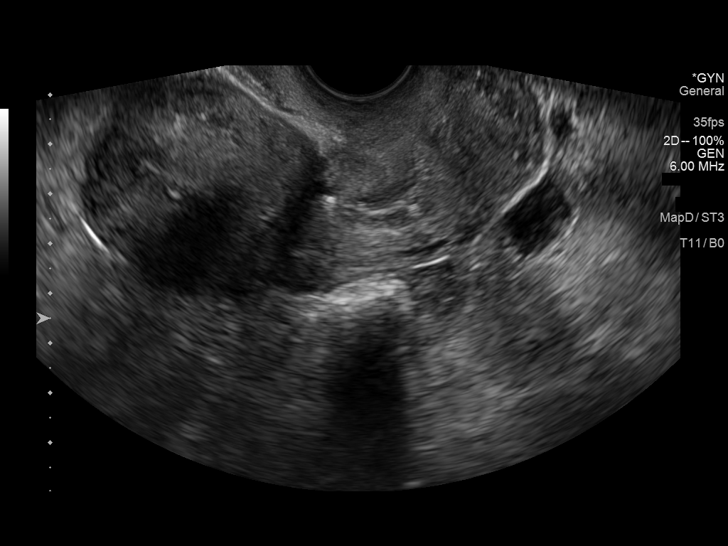
[im 19/73]
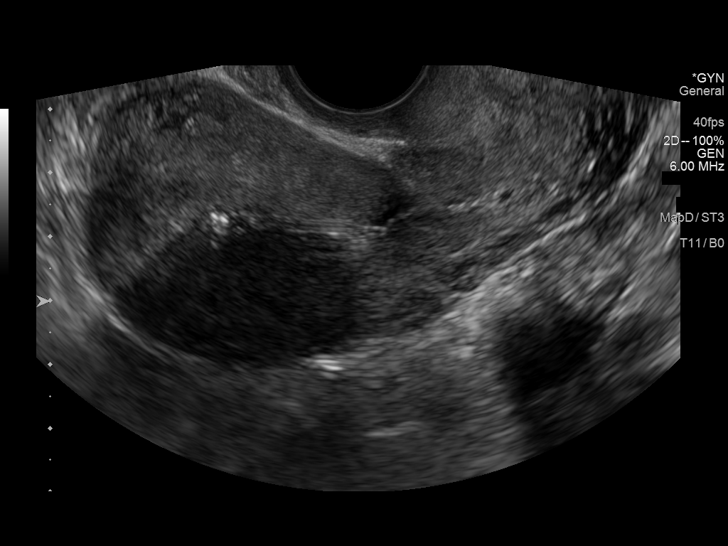
[im 25/73]
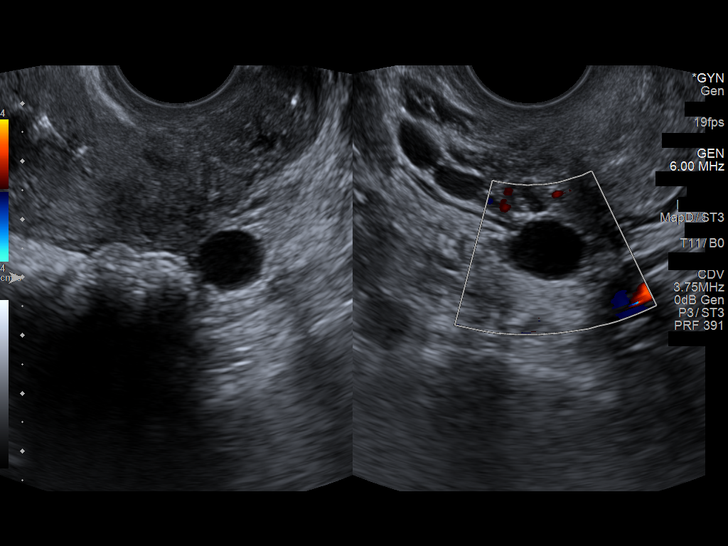
[im 28/73]
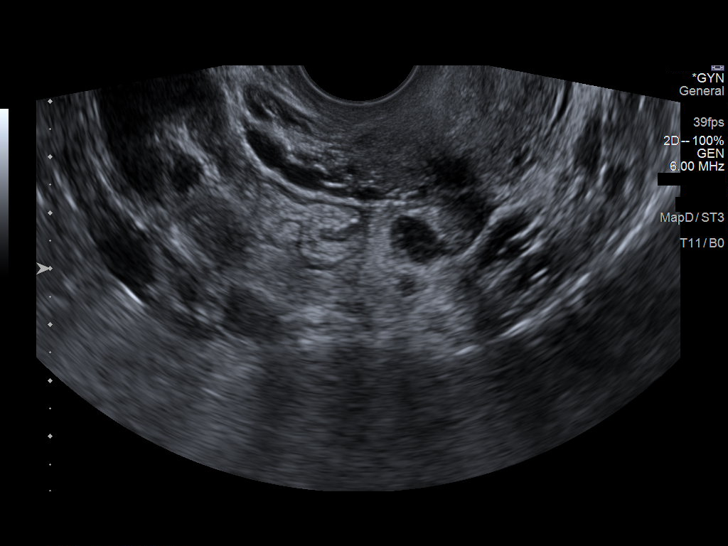
[im 34/73]
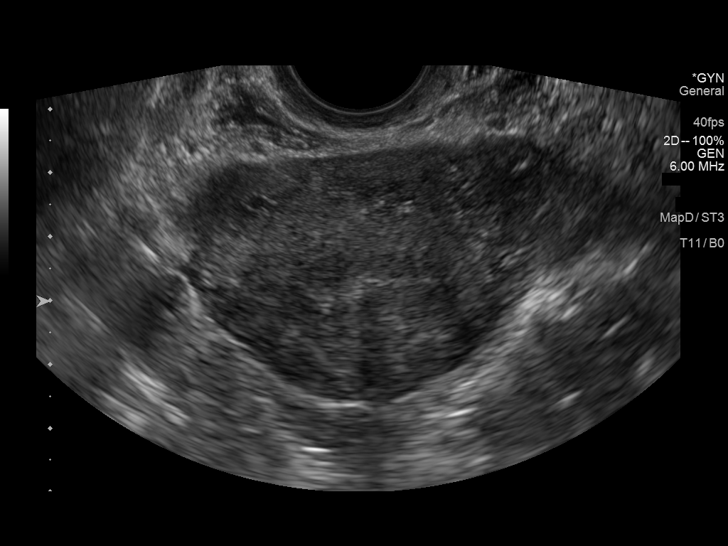
[im 40/73]
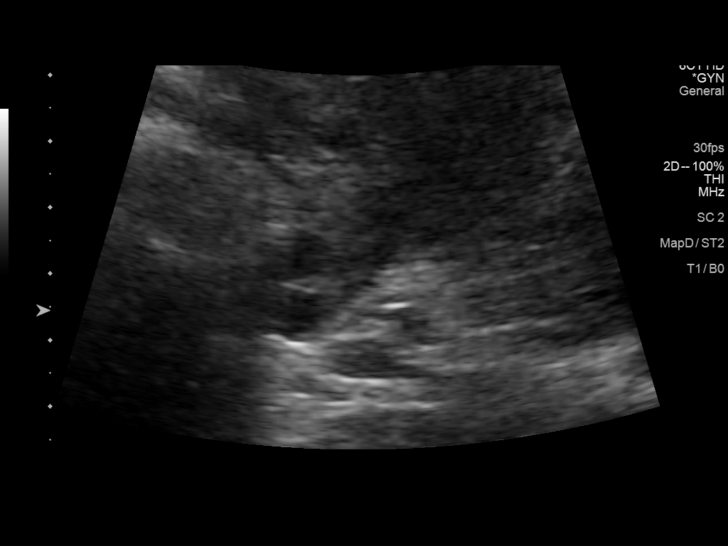
[im 46/73]
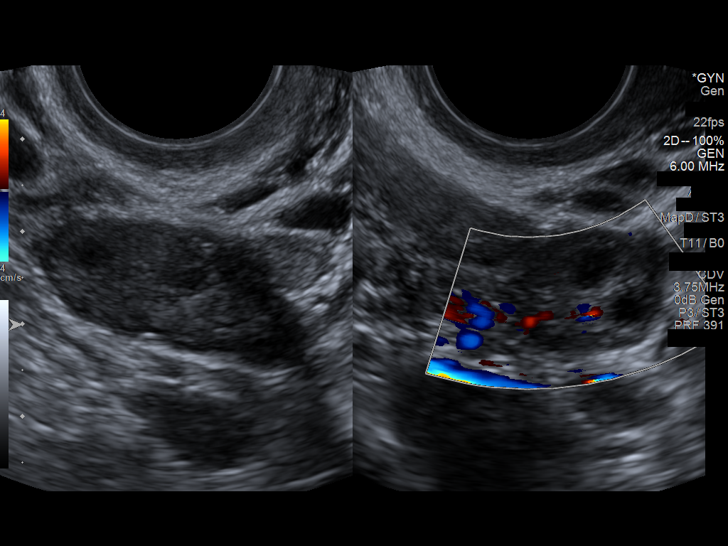
[im 49/73]
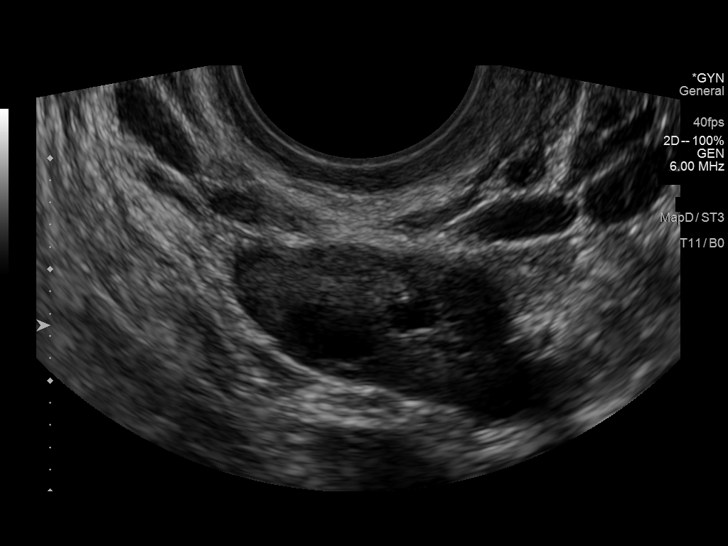
[im 55/73]
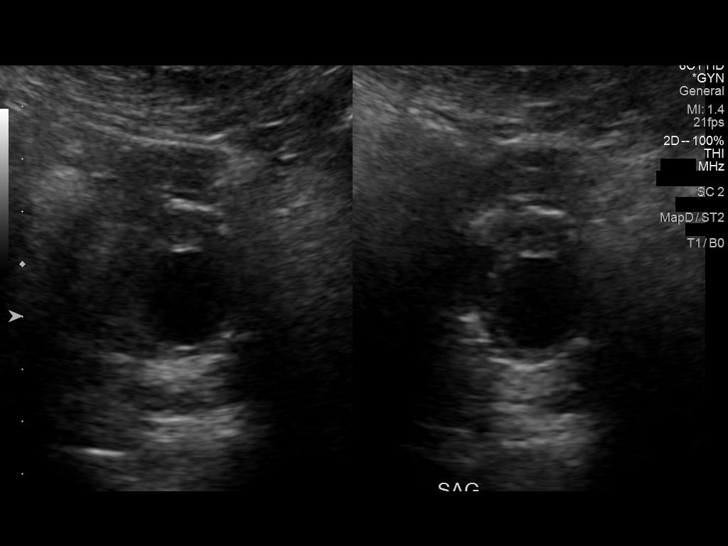
[im 61/73]
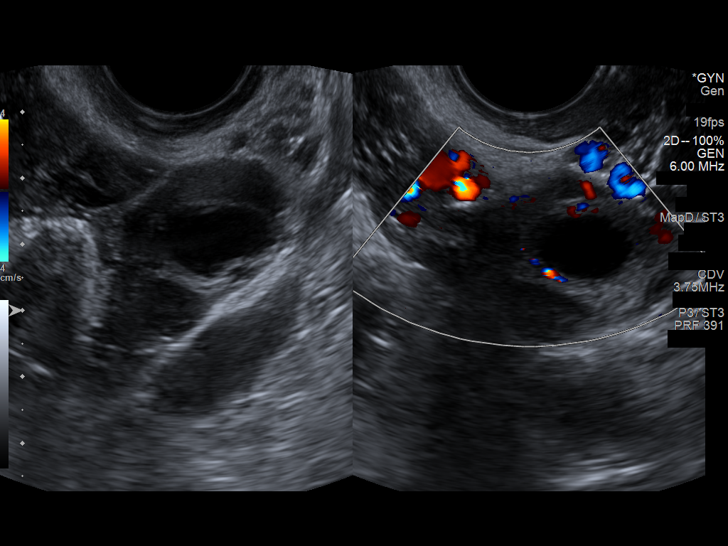
[im 67/73]
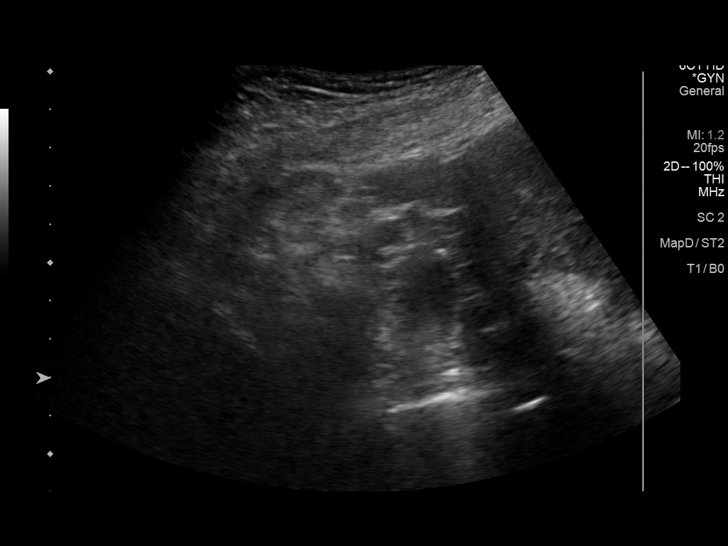
[im 73/73]
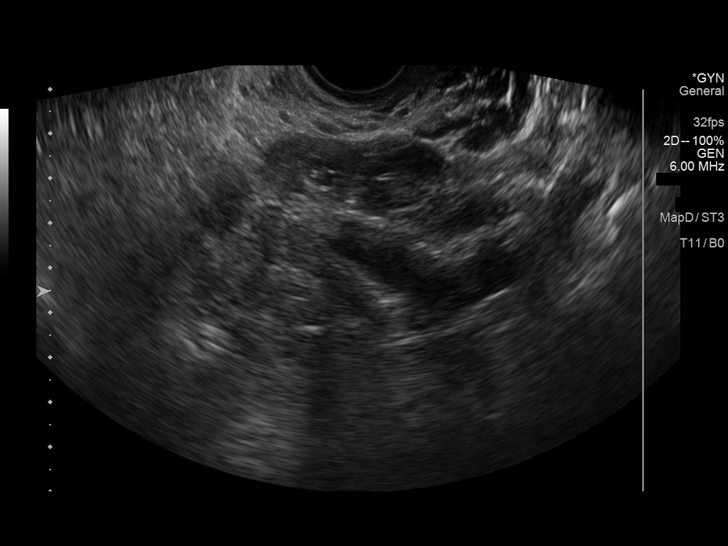

[14 of 25 positions shown; findings below may reference images not displayed]

FINDINGS: Uterus

Measurements: 9.4 x 4.4 x 5.1 cm = volume: 112 mL. No fibroids.
Possible small exophytic cyst off the cervix measuring 1.2 cm, of
doubtful significance.

Endometrium

Thickness: 3.8 mm.  IUD appears grossly appropriate in position.

Right ovary

Measurements: 3.2 x 1.3 x 2.4 cm = volume: 5 mL. Normal
appearance/no adnexal mass.

Left ovary

Measurements: 4.4 x 2.2 x 4.4 cm = volume: 22 mL. Normal
appearance/no adnexal mass.

Other findings

Trace free fluid in the pelvis.
IMPRESSION: 1. IUD appears grossly appropriate in position by sonography
2. Trace free fluid in the pelvis

## 2021-03-17 ENCOUNTER — Other Ambulatory Visit: Payer: Self-pay

## 2021-03-17 ENCOUNTER — Emergency Department (HOSPITAL_COMMUNITY)
Admission: EM | Admit: 2021-03-17 | Discharge: 2021-03-17 | Disposition: A | Discharge status: Home / Self Care | Payer: Medicaid Other | Attending: Emergency Medicine | Admitting: Emergency Medicine

## 2021-03-17 DIAGNOSIS — J029 Acute pharyngitis, unspecified: Secondary | ICD-10-CM | POA: Insufficient documentation

## 2021-03-17 DIAGNOSIS — Z20822 Contact with and (suspected) exposure to covid-19: Secondary | ICD-10-CM | POA: Diagnosis not present

## 2021-03-17 LAB — RESP PANEL BY RT-PCR (FLU A&B, COVID) ARPGX2
Influenza A by PCR: NEGATIVE
Influenza B by PCR: NEGATIVE
SARS Coronavirus 2 by RT PCR: NEGATIVE

## 2021-03-17 LAB — GROUP A STREP BY PCR: Group A Strep by PCR: NOT DETECTED

## 2021-03-17 MED ORDER — MENTHOL 3 MG MT LOZG
1.0000 | LOZENGE | Freq: Once | OROMUCOSAL | Status: DC
  Filled 2021-03-17: qty 9

## 2021-03-17 MED ORDER — BENZONATATE 100 MG PO CAPS: 100.0000 mg | ORAL_CAPSULE | Freq: Three times a day (TID) | ORAL | 0 refills | Status: DC

## 2021-03-17 MED ORDER — BENZONATATE 100 MG PO CAPS
100.0000 mg | ORAL_CAPSULE | Freq: Once | ORAL | Status: AC
  Administered 2021-03-17: 100 mg via ORAL
  Filled 2021-03-17: qty 1

## 2021-03-17 NOTE — Discharge Instructions (Signed)
The strep test and COVID flu tests were negative.  Take over-the-counter sprays or lozenges such as Cepacol to help with your sore throat.  Take the Tessalon to help with the coughing.  You can also take over-the-counter medication such as Tylenol or ibuprofen.  Follow-up if the symptoms do not improve by the end of the week

## 2021-03-17 NOTE — ED Provider Notes (Signed)
[facility] EMERGENCY DEPARTMENT Provider Note   CSN: [id number] Arrival date & time: 03/17/21  1747     History Chief Complaint  Patient presents with   Sore Throat    Yvonne [name] is a 30 y.o. female.   Sore Throat   Patient complains of sore throat that started a couple days ago.  Patient states she has had a lot of mucus drainage down the back of her throat.  She feels swelling and irritation down the back of her throat.  She has some hoarseness of her voice.  She has been coughing.  No fevers.  No vomiting or diarrhea.  Past Medical History:  Diagnosis Date   ADHD (attention deficit hyperactivity disorder)    off meds since 2011   Chlamydia 02/2012   treated at [facility]   Vitamin D deficiency     Patient Active Problem List   Diagnosis Date Noted   S/P primary low transverse C-section 12/29/2016   High risk sexual behavior 01/14/2016   Healthcare maintenance 01/14/2016   Vitamin D deficiency 06/29/2015    Past Surgical History:  Procedure Laterality Date   CESAREAN SECTION N/A 12/29/2016   Procedure: CESAREAN SECTION;  Surgeon: Huel Cote, MD;  Location: [facility] BIRTHING SUITES;  Service: Obstetrics;  Laterality: N/A;     OB History     Gravida  1   Para  1   Term  1   Preterm  0   AB  0   Living  1      SAB  0   IAB  0   Ectopic  0   Multiple  0   Live Births  1           Family History  Problem Relation Age of Onset   Hypertension Mother    Diabetes Father    Hypertension Father    Cancer Father        leukemia in his 69's   Cancer Paternal Aunt        breast cancer   Drug abuse Paternal Uncle     Social History   Tobacco Use   Smoking status: Never   Smokeless tobacco: Never  Vaping Use   Vaping Use: Never used  Substance Use Topics   Alcohol use: Not Currently    Alcohol/week: 0.0 standard drinks   Drug use: No    Home Medications Prior to Admission medications   Medication  Sig Start Date End Date Taking? Authorizing Provider  benzonatate (TESSALON) 100 MG capsule Take 1 capsule (100 mg total) by mouth every 8 (eight) hours. 03/17/21  Yes Linwood Dibbles, MD  amoxicillin (AMOXIL) 500 MG capsule Take 1 capsule (500 mg total) by mouth 3 (three) times daily. 02/03/20   Tysinger, Kermit Balo, PA-C  ergocalciferol (VITAMIN D2) 1.25 MG (50000 UT) capsule Take 1 capsule (50,000 Units total) by mouth once a week. 03/17/19   Joselyn Arrow, MD  levonorgestrel (MIRENA) 20 MCG/24HR IUD 1 each by Intrauterine route once.    [provider]  ondansetron (ZOFRAN ODT) 4 MG disintegrating tablet Take 1 tablet (4 mg total) by mouth every 8 (eight) hours as needed for nausea or vomiting. Patient not taking: Reported on 03/16/2019 02/18/19   Robinson, [place] N, PA-C    Allergies    Patient has no known allergies.  Review of Systems   Review of Systems  All other systems reviewed and are negative.  Physical Exam Updated Vital Signs BP 110/79  Pulse (!) 110   Temp 99.2 F (37.3 C)   Resp 16   SpO2 100%   Physical Exam Vitals and nursing note reviewed.  Constitutional:      General: She is not in acute distress.    Appearance: She is well-developed.  HENT:     Head: Normocephalic and atraumatic.     Right Ear: Ear canal and external ear normal.     Left Ear: Ear canal and external ear normal.     Nose: No rhinorrhea.     Mouth/Throat:     Mouth: Mucous membranes are moist. No oral lesions.     Pharynx: Uvula midline. No pharyngeal swelling, oropharyngeal exudate or uvula swelling.     Tonsils: No tonsillar exudate or tonsillar abscesses.  Eyes:     General: No scleral icterus.       Right eye: No discharge.        Left eye: No discharge.     Conjunctiva/sclera: Conjunctivae normal.  Neck:     Trachea: No tracheal deviation.  Cardiovascular:     Rate and Rhythm: Normal rate and regular rhythm.  Pulmonary:     Effort: Pulmonary effort is normal. No respiratory  distress.     Breath sounds: Normal breath sounds. No stridor. No wheezing or rales.     Comments: Coughing intermittently at the bedside Abdominal:     General: Bowel sounds are normal. There is no distension.     Palpations: Abdomen is soft.     Tenderness: There is no abdominal tenderness. There is no guarding or rebound.  Musculoskeletal:        General: No tenderness or deformity.     Cervical back: Neck supple.  Skin:    General: Skin is warm and dry.     Findings: No rash.  Neurological:     General: No focal deficit present.     Mental Status: She is alert.     Cranial Nerves: No cranial nerve deficit (no facial droop, extraocular movements intact, no slurred speech).     Sensory: No sensory deficit.     Motor: No abnormal muscle tone or seizure activity.     Coordination: Coordination normal.  Psychiatric:        Mood and Affect: Mood normal.    ED Results / Procedures / Treatments   Labs (all labs ordered are listed, but only abnormal results are displayed) Labs Reviewed  GROUP A STREP BY PCR  RESP PANEL BY RT-PCR (FLU A&B, COVID) [id number]    EKG None  Radiology No results found.  Procedures Procedures   Medications Ordered in ED Medications  menthol-cetylpyridinium (CEPACOL) lozenge 3 mg (has no administration in time range)  benzonatate (TESSALON) capsule 100 mg (100 mg Oral Given 03/17/21 2005)    ED Course  I have reviewed the triage vital signs and the nursing notes.  Pertinent labs & imaging results that were available during my care of the patient were reviewed by me and considered in my medical decision making (see chart for details).  Clinical Course as of 03/17/21 2006  Sun Mar 17, 2021  1949 COVID and flu are negative.  Strep test negative [JK]    Clinical Course User Index [JK] Linwood Dibbles, MD   MDM Rules/Calculators/A&P                           Patient presented to the ED with complaints of sore throat.  No  exudate noted on exam.  No  erythema or edema.  No findings to suggest peritonsillar abscess.  Patient is able to speak without difficulty.  Strep influenza and COVID test are negative.  Suspect she is having a viral pharyngitis and a URI.  Will discharge home with prescription for Tessalon to help with the cough and recommend over-the-counter medication such as Cepacol to help with the discomfort. Final Clinical Impression(s) / ED Diagnoses Final diagnoses:  Viral pharyngitis    Rx / DC Orders ED Discharge Orders          Ordered    benzonatate (TESSALON) 100 MG capsule  Every 8 hours        03/17/21 2004             Linwood Dibbles, MD 03/17/21 2006

## 2021-03-17 NOTE — ED Triage Notes (Signed)
Pt here from home for sore throat x1 day, pt reports she has had trouble swallowing, and had some drooling last night. Pt also stated she woke up today w/ "crust all over" her eyes and they were pink. Pt denies fever/chills, N/V/D

## 2022-05-29 ENCOUNTER — Ambulatory Visit (INDEPENDENT_AMBULATORY_CARE_PROVIDER_SITE_OTHER): Payer: Medicaid Other | Admitting: Family Medicine

## 2022-05-29 ENCOUNTER — Encounter: Payer: Self-pay | Admitting: Family Medicine

## 2022-05-29 VITALS — BP 114/73 | HR 99 | Ht 60.0 in | Wt 146.0 lb

## 2022-05-29 DIAGNOSIS — Z30432 Encounter for removal of intrauterine contraceptive device: Secondary | ICD-10-CM | POA: Diagnosis not present

## 2022-05-29 NOTE — Progress Notes (Signed)
New pt is in the office, has IUD that was placed in 2018 and would like it removed today. Pt desires to get pregnant. Pt [facility] get [facility] but it was unsuccessful. [facility] Pt states that she also had a pap smear at [facility] in Dec with normal results

## 2022-05-29 NOTE — Progress Notes (Signed)
32 year old female, referred by the [facility] for an IUD removal.  She had IUD placed back in 2018.  She would like the IUD removed, so as to achieve pregnancy.  She was seen at [facility] back in December 2023.  Strings were not visible.  She reports having an ultrasound scan that was done, which showed that the "IUD strings were inverted "per patient.  Ultrasound report is not available at this time for review.  IUD Removal  Patient was in the dorsal lithotomy position, normal external genitalia was noted.  A speculum was placed in the patient's vagina, normal discharge was noted, no lesions. The multiparous cervix was visualized, no lesions, no abnormal discharge. No IUD strings visible through the cervical os.   Following this, I tried both a cyto brush and a pair of kelly's forceps, respectively, but could not get the strings out through the cervical os. At this point, a Point-of-care [place] was performed, which showed the IUD in the endometrium, but inverted, with the stems of the T distal to the long tail. At this point, a pair of tenculum was applied to the cervix and a string finder was inserted into the internal os; and withdrawn, and with it the mirena IUD was located and the entire device was withdrawn through the cervical os successfully. Patient tolerated the procedure well.    Liliane Channel MD MPH OB Fellow, [place] [facility] [place] 05/29/2022

## 2022-06-03 DIAGNOSIS — H00014 Hordeolum externum left upper eyelid: Secondary | ICD-10-CM | POA: Diagnosis not present

## 2022-09-23 ENCOUNTER — Encounter (HOSPITAL_COMMUNITY): Payer: Self-pay | Admitting: *Deleted

## 2022-09-23 ENCOUNTER — Inpatient Hospital Stay (HOSPITAL_COMMUNITY)
Admission: AD | Admit: 2022-09-23 | Discharge: 2022-09-23 | Disposition: A | Discharge status: Home / Self Care | Payer: 59 | Attending: Family Medicine | Admitting: Family Medicine

## 2022-09-23 ENCOUNTER — Inpatient Hospital Stay (HOSPITAL_COMMUNITY): Payer: 59

## 2022-09-23 DIAGNOSIS — O200 Threatened abortion: Secondary | ICD-10-CM

## 2022-09-23 DIAGNOSIS — O26851 Spotting complicating pregnancy, first trimester: Secondary | ICD-10-CM | POA: Diagnosis not present

## 2022-09-23 DIAGNOSIS — Z3A01 Less than 8 weeks gestation of pregnancy: Secondary | ICD-10-CM

## 2022-09-23 DIAGNOSIS — O209 Hemorrhage in early pregnancy, unspecified: Secondary | ICD-10-CM | POA: Diagnosis not present

## 2022-09-23 DIAGNOSIS — Z3A08 8 weeks gestation of pregnancy: Secondary | ICD-10-CM | POA: Diagnosis not present

## 2022-09-23 DIAGNOSIS — Z3490 Encounter for supervision of normal pregnancy, unspecified, unspecified trimester: Secondary | ICD-10-CM

## 2022-09-23 DIAGNOSIS — O418X10 Other specified disorders of amniotic fluid and membranes, first trimester, not applicable or unspecified: Secondary | ICD-10-CM

## 2022-09-23 DIAGNOSIS — O26859 Spotting complicating pregnancy, unspecified trimester: Secondary | ICD-10-CM

## 2022-09-23 LAB — ABO/RH: ABO/RH(D): A POS

## 2022-09-23 LAB — HCG, QUANTITATIVE, PREGNANCY: hCG, Beta Chain, Quant, S: 136265 m[IU]/mL — ABNORMAL HIGH (ref <5)

## 2022-09-23 LAB — WET PREP, GENITAL
Clue Cells Wet Prep HPF POC: NONE SEEN
Sperm: NONE SEEN
Trich, Wet Prep: NONE SEEN
WBC, Wet Prep HPF POC: <10 (ref <10)
Yeast Wet Prep HPF POC: NONE SEEN

## 2022-09-23 LAB — POCT PREGNANCY, URINE: Preg Test, Ur: POSITIVE — AB

## 2022-09-23 MED ORDER — PREPLUS 27-1 MG PO TABS: 1.0000 | ORAL_TABLET | Freq: Every day | ORAL | 13 refills | Status: DC

## 2022-09-23 NOTE — MAU Provider Note (Signed)
History     CSN: [id number]  Arrival date and time: 09/23/22 1127   Event Date/Time   First Provider Initiated Contact with Patient 09/23/22 1158      Chief Complaint  Patient presents with   Vaginal Bleeding   Vaginal Bleeding Pertinent negatives include no abdominal pain, back pain, chills, diarrhea, dysuria, fever, flank pain, nausea, rash, sore throat or vomiting.   Patient is 32 y.o. G2P1001 [redacted]w[redacted]d here with complaints of spotting. [facility] and reports a gestational sac but no IUP. She reports pink spotting. She has IRREGULAR periods since her IUD was removed in Feb (05/29/22). She reports bleeding in early and mid March and then one day of spotting in April.  Denies vaginal discharge. Last STI testing in the system was in 2020   OB History     Gravida  2   Para  1   Term  1   Preterm  0   AB  0   Living  1      SAB  0   IAB  0   Ectopic  0   Multiple  0   Live Births  1           Past Medical History:  Diagnosis Date   ADHD (attention deficit hyperactivity disorder)    off meds since 2011   Chlamydia 02/2012   treated at [facility]   Vitamin D deficiency     Past Surgical History:  Procedure Laterality Date   CESAREAN SECTION N/A 12/29/2016   Procedure: CESAREAN SECTION;  Surgeon: Huel Cote, MD;  Location: [facility] BIRTHING SUITES;  Service: Obstetrics;  Laterality: N/A;    Family History  Problem Relation Age of Onset   Hypertension Mother    Diabetes Father    Hypertension Father    Cancer Father        leukemia in his 39's   Cancer Paternal Aunt        breast cancer   Drug abuse Paternal Uncle     Social History   Tobacco Use   Smoking status: Never   Smokeless tobacco: Never  Vaping Use   Vaping Use: Never used  Substance Use Topics   Alcohol use: Not Currently    Alcohol/week: 0.0 standard drinks of alcohol   Drug use: No    Allergies: No Known Allergies  Medications Prior to  Admission  Medication Sig Dispense Refill Last Dose   levonorgestrel (MIRENA) 20 MCG/24HR IUD 1 each by Intrauterine route once. (Patient not taking: Reported on 05/29/2022)       Review of Systems  Constitutional:  Negative for chills and fever.  HENT:  Negative for congestion and sore throat.   Eyes:  Negative for pain and visual disturbance.  Respiratory:  Negative for cough, chest tightness and shortness of breath.   Cardiovascular:  Negative for chest pain.  Gastrointestinal:  Negative for abdominal pain, diarrhea, nausea and vomiting.  Endocrine: Negative for cold intolerance and heat intolerance.  Genitourinary:  Positive for vaginal bleeding. Negative for dysuria and flank pain.  Musculoskeletal:  Negative for back pain.  Skin:  Negative for rash.  Allergic/Immunologic: Negative for food allergies.  Neurological:  Negative for dizziness and light-headedness.  Psychiatric/Behavioral:  Negative for agitation.    Physical Exam   Blood pressure (!) 118/57, pulse (!) 106, temperature 98.9 F (37.2 C), resp. rate 18, height 5' (1.524 m), weight 68.5 kg, last menstrual period 07/25/2022.  Physical Exam Vitals  and nursing note reviewed.  Constitutional:      General: She is not in acute distress.    Appearance: She is well-developed.  HENT:     Head: Normocephalic and atraumatic.  Eyes:     General: No scleral icterus.    Conjunctiva/sclera: Conjunctivae normal.  Cardiovascular:     Rate and Rhythm: Normal rate.  Pulmonary:     Effort: Pulmonary effort is normal.  Chest:     Chest wall: No tenderness.  Abdominal:     Palpations: Abdomen is soft.     Tenderness: There is no abdominal tenderness. There is no guarding or rebound.  Genitourinary:    Vagina: Normal.  Musculoskeletal:        General: Normal range of motion.     Cervical back: Normal range of motion and neck supple.  Skin:    General: Skin is warm and dry.     Findings: No rash.  Neurological:     Mental  Status: She is alert and oriented to person, place, and time.     MAU Course  Procedures  MDM: high  This patient presents to the ED for concern of   Chief Complaint  Patient presents with   Vaginal Bleeding     These complains involves an extensive number of treatment options, and is a complaint that carries with it a high risk of complications and morbidity.  The differential diagnosis for  1.vaginal bleeding in early pregnancy INCLUDES threatened miscarriage, ectopic pregnancy (unless IUP confirmed), normal variant bleeding with live IUP-mostly likely subchorionic hemorrhage in this case, vs earlier than expected pregnancy. Most likely for this patient is earlier than expected gestational age given her irregular bleeding.     Co morbidities that complicate the patient evaluation: Patient Active Problem List   Diagnosis Date Noted   S/P primary low transverse C-section 12/29/2016   High risk sexual behavior 01/14/2016   Healthcare maintenance 01/14/2016   Vitamin D deficiency 06/29/2015    External records from outside source obtained and reviewed including CareEverywhere and Prenatal care records  Lab Tests: BHCG  I ordered, and personally interpreted labs.  The pertinent results include:  elevated BHCG and expect that I would see a FHR at this level  Imaging Studies ordered:  I ordered imaging studies includingTransvaginal [place] I independently visualized and interpreted imaging which showed Fetal pole but no FHR which is concerning for failed pregnancy I agree with the radiologist interpretation    Reevaluation of the patient after these medicines showed that the patient improved I have reviewed the patients home medicines and have made adjustments as needed    MAU Course: 2:07 PM  updated patient with [place] images and discussed pregnancy of unknown viability. Does not meet definitive pregnancy failure.  I discussed with patient that this is very concerning for a failed  pregnancy given her pregnancy hormone is appropriately elevated.     After the interventions noted above, I reevaluated the patient and found that they have :improved  Dispostion: discharged   Assessment and Plan   1. Spotting in early pregnancy   2. Early stage of pregnancy   3. Threatened miscarriage   4. Subchorionic hematoma in first trimester, single or unspecified fetus    - Discussed the importance of repeat bhcg in 48 hrs and repeat US in 10-14 - I personally made appointments for the patient and reviewed the dates and times.  - Reviewed in detail reasons to return to MAU -- specifically miscarriage precautions.  -  discussed that findings are suggestive of failed pregnancy and that more information is needed. Patient voiced understanding. She was focused on the fact that she was earlier pregnant ([redacted]w[redacted]d)  Future Appointments  Date Time Provider Department Center  09/25/2022 11:00 AM [facility]  10/14/2022  8:00 AM [facility] US2 [facility]     Isa Rankin Quin Mcpherson 09/23/2022, 11:58 AM

## 2022-09-23 NOTE — Discharge Instructions (Signed)
You are very early pregnant. We were unable to see a heartbeat today. We recommend repeat blood work as scheduled in 2 days and another Korea in 2 weeks. This Korea has been scheduled for you at the [facility].  Please call that office if you are unable to attend that visit.

## 2022-09-23 NOTE — MAU Note (Signed)
.  Yvonne [name] is a 32 y.o. at Unknown here in MAU reporting: having some spotting today denies any cramping. [facility] and saw a gestational sac. LMP: 07/25/22 Onset of complaint: today Pain score: 0 Vitals:   09/23/22 1148  BP: (!) 118/57  Pulse: (!) 106  Resp: 18  Temp: 98.9 F (37.2 C)     FHT:n/a Lab orders placed from triage:   UPT

## 2022-09-24 ENCOUNTER — Telehealth: Payer: Self-pay | Admitting: *Deleted

## 2022-09-24 DIAGNOSIS — O3680X0 Pregnancy with inconclusive fetal viability, not applicable or unspecified: Secondary | ICD-10-CM

## 2022-09-24 DIAGNOSIS — O418X10 Other specified disorders of amniotic fluid and membranes, first trimester, not applicable or unspecified: Secondary | ICD-10-CM

## 2022-09-24 DIAGNOSIS — Z3490 Encounter for supervision of normal pregnancy, unspecified, unspecified trimester: Secondary | ICD-10-CM

## 2022-09-24 DIAGNOSIS — O200 Threatened abortion: Secondary | ICD-10-CM

## 2022-09-24 DIAGNOSIS — O26859 Spotting complicating pregnancy, unspecified trimester: Secondary | ICD-10-CM

## 2022-09-24 LAB — GC/CHLAMYDIA PROBE AMP (~~LOC~~) NOT AT ARMC
Chlamydia: NEGATIVE
Comment: NEGATIVE
Comment: NORMAL
Neisseria Gonorrhea: NEGATIVE

## 2022-09-24 NOTE — Addendum Note (Signed)
Addended by: Raynald Blend L on: 09/24/2022 10:14 AM   Modules accepted: Orders

## 2022-09-24 NOTE — Telephone Encounter (Signed)
Received a voicemail from 09/23/22 1:09pm from [facility] from [facility] stating she is referring a patient she saw today States patient brought a proof of pregnancy from [facility] from 08/27/22 that shows Positive UPT , they did not repeat test. States patient reports LMP 07/25/22. States should have been [redacted]w[redacted]d and on Ultrasound she saw a rather large gestational sac but did not see embryo, but may be a shadow of gestational sac- not definitive, no fetal pole. States patient denies cramping , or bleeding but reports had one spot of pinkish when she wiped that am.  States they are referring to [place]. Per chart patient also went to [facility]U for evaluation 09/23/22 and dx with spotting in early pregnancy and threatened miscarriage with subchorionic hematoma in first trimester. Plan of care is stat bhcg 09/25/22 and repeat viability Korea scheduled for 6/ 18/24 0800.  Per protocol I called [facility] and informed her we got the [facility] and and also see that she went to MAU for evaluation. I reviewed plan of care of stat bhcg tomorrow 09/25/22 and Korea 10/14/22 and that plan may change based on stat bhcg results. I also reviewed if she has bleeding like a period or severe pain to go back to [facility].  She voices understanding and reports she is only having spotting so far.  Per protocol I also called [facility] and spoke with Hedwig Morton, RN and informed her we got the referral and that patient had also went to the hospital and there is a new plan of care but I had spoken with patient. [name]

## 2022-09-25 ENCOUNTER — Other Ambulatory Visit (INDEPENDENT_AMBULATORY_CARE_PROVIDER_SITE_OTHER): Payer: 59 | Admitting: *Deleted

## 2022-09-25 VITALS — BP 111/61 | HR 89 | Ht 60.0 in | Wt 151.3 lb

## 2022-09-25 DIAGNOSIS — O3680X0 Pregnancy with inconclusive fetal viability, not applicable or unspecified: Secondary | ICD-10-CM | POA: Diagnosis not present

## 2022-09-25 DIAGNOSIS — Z3A08 8 weeks gestation of pregnancy: Secondary | ICD-10-CM

## 2022-09-25 LAB — BETA HCG QUANT (REF LAB): hCG Quant: 85595 m[IU]/mL

## 2022-09-25 NOTE — Progress Notes (Signed)
Here for stat bhcg. Denies pain. Denies bleeding but is still having a small amount of brownish vaginal discharge that is less than before and it was pink before.  Explained we will draw stat bhcg and have her leave office. She will be called with results and plan of care in about 2-4 hours after results received and reviewed by provider.. She voices understanding.   [name] 4:20: Results received and reviewed with Dr. Crissie Reese and he advised downtrending bhcg consistent with miscarriage.He advises Has confirmed IUP, does not need to have her hcg level trended -Please offer condolences and a visit to discuss management option .   I called Yvonne [name] and reviewed results and recommendations per Dr. Crissie Reese. I also offered our condolences. I offered follow up with provider in a few weeks which she accepted. I informed her I will forward to registar who will schedule and contact her with appointment. She voices understanding. [name]

## 2022-09-28 ENCOUNTER — Inpatient Hospital Stay (HOSPITAL_COMMUNITY)
Admission: AD | Admit: 2022-09-28 | Discharge: 2022-09-28 | Disposition: A | Discharge status: Home / Self Care | Payer: 59 | Attending: Obstetrics & Gynecology | Admitting: Obstetrics & Gynecology

## 2022-09-28 ENCOUNTER — Encounter (HOSPITAL_COMMUNITY): Payer: Self-pay | Admitting: Obstetrics & Gynecology

## 2022-09-28 DIAGNOSIS — O034 Incomplete spontaneous abortion without complication: Secondary | ICD-10-CM

## 2022-09-28 DIAGNOSIS — Z3A09 9 weeks gestation of pregnancy: Secondary | ICD-10-CM | POA: Diagnosis not present

## 2022-09-28 LAB — CBC
HCT: 39.1 % (ref 36.0–46.0)
Hemoglobin: 12.1 g/dL (ref 12.0–15.0)
MCH: 26.2 pg (ref 26.0–34.0)
MCHC: 30.9 g/dL (ref 30.0–36.0)
MCV: 84.8 fL (ref 80.0–100.0)
Platelets: 224 10*3/uL (ref 150–400)
RBC: 4.61 MIL/uL (ref 3.87–5.11)
RDW: 12.5 % (ref 11.5–15.5)
WBC: 6.6 10*3/uL (ref 4.0–10.5)
nRBC: 0 % (ref 0.0–0.2)

## 2022-09-28 NOTE — MAU Note (Addendum)
Yvonne [name] is a 32 y.o. at [redacted]w[redacted]d here in MAU reporting: was told she was having a miscarriage.  Was not scheduled for another f/u appt, only grief counseling. Has had some brown d/c(none today), not coffee ground or period like bleeding. No abd pain, occ discomfort.   Has had intermittent HA, has not taken anything because it goes away.   Onset of complaint: ongoing Pain score: mild HA Vitals:   09/28/22 1249  BP: 110/67  Pulse: 89  Resp: 17  Temp: 99 F (37.2 C)  SpO2: 100%     Lab orders placed from triage:  none     Reviewed labs and notes from prior visit.  Was to have f/u to 'discuss options' per note, this had not been done yet.. providers notifed.

## 2022-09-28 NOTE — MAU Provider Note (Signed)
History    Event Date/Time   First Provider Initiated Contact with Patient 09/28/22 1407     Chief Complaint:  dx miscarriage, no symptoms of that   Yvonne [name] is  32 y.o. G2P1001 Patient's last menstrual period was 07/25/2022.Marland Kitchen Patient is here to discuss management of missed AB Since her last visit, the patient is without new complaint.  Had ultrasound 09/23/2022 showing 6-week 1 day IUP without cardiac activity.  Follow-up quant 09/25/2022 showed large drop in hCG consistent with miscarriage.  Results were communicated to patient by RN but patient wanted to talk to provider about all management options.  ROS Abdominal Pain: Denies Vaginal bleeding: spotting.   Passage of clots or tissue: Denies Dizziness: Mild  A POS  Her previous Quantitative HCG values are:  Latest Reference Range & Units 09/23/22 12:24 09/25/22 11:17  hCG Quant mIU/mL  85,595  HCG, Beta Chain, Quant, S <5 mIU/mL 136,265 (H)   (H): Data is abnormally high  Physical Exam   Patient Vitals for the past 24 hrs:  BP Temp Temp src Pulse Resp SpO2 Height Weight  09/28/22 1249 110/67 99 F (37.2 C) Oral 89 17 100 % 5' (1.524 m) 67.9 kg   Constitutional: Well-nourished female in no apparent distress. No pallor Neuro: Alert and oriented 4 Cardiovascular: Normal rate Respiratory: Normal effort and rate Abdomen: deferred Gynecological Exam: examination not indicated  Labs: Results for orders placed or performed during the hospital encounter of 09/28/22 (from the past 24 hour(s))  CBC   Collection Time: 09/28/22  2:17 PM  Result Value Ref Range   WBC 6.6 4.0 - 10.5 K/uL   RBC 4.61 3.87 - 5.11 MIL/uL   Hemoglobin 12.1 12.0 - 15.0 g/dL   HCT 16.1 09.6 - 04.5 %   MCV 84.8 80.0 - 100.0 fL   MCH 26.2 26.0 - 34.0 pg   MCHC 30.9 30.0 - 36.0 g/dL   RDW 40.9 81.1 - 91.4 %   Platelets 224 150 - 400 K/uL   nRBC 0.0 0.0 - 0.2 %    Ultrasound Studies:   US OB LESS THAN 14 WEEKS WITH OB TRANSVAGINAL  Result  Date: 09/23/2022 CLINICAL DATA:  Vaginal bleeding EXAM: OBSTETRIC <14 WK [place] AND TRANSVAGINAL OB US TECHNIQUE: Both transabdominal and transvaginal ultrasound examinations were performed for complete evaluation of the gestation as well as the maternal uterus, adnexal regions, and pelvic cul-de-sac. Transvaginal technique was performed to assess early pregnancy. COMPARISON:  None Available. FINDINGS: Intrauterine gestational sac: Single Yolk sac: Visualized, measures 6 mm. Embryo:  Visualized. Cardiac Activity: Not Visualized. CRL:  4.2 mm   6 w   1 d                  [place] EDC: 05/18/2023 Subchorionic hemorrhage:  Moderate, measures 2.4 x 3.8 cm. Maternal uterus/adnexae: Normal appearance of the bilateral ovaries. Left corpus luteum cyst. No free fluid seen in the pelvis. IMPRESSION: 1. Embryo present with no cardiac activity visualized. Findings are suspicious but not yet definitive for failed pregnancy. Recommend follow-up US in 10-14 days for definitive diagnosis. This recommendation follows SRU consensus guidelines: Diagnostic Criteria for Nonviable Pregnancy Early in the First Trimester. Malva Limes Med 2013; 782:9562-13. 2. Moderate subchorionic hemorrhage. Electronically Signed   By: Allegra Lai M.D.   On: 09/23/2022 13:12    MAU course/MDM: -Patient with scant bleeding since ultrasound on 09/23/2022 not consistent with completed miscarriage. Discussed options for management of incomplete AB including expectant management, Cytotec  or D&C.  Patient prefers method where she will not have to see baby and products of conception pass.  Request D&C.  Message sent to [facility] surgery scheduler.  Support given.  Patient has been referred to support group.  SAB precautions.   Assessment: 1. Incomplete miscarriage  Plan: Discharge home in stable condition. SAB precautions  Follow-up Information     [facility] Follow up.   Specialty: Obstetrics and  Gynecology Why: Will call you to schedule Dilation and Curretage Contact information: [address] [place] [address] [phone]        [facility] Follow up.   Specialty: Obstetrics and Gynecology Why: As needed in emergencies (heavy bleeding, severe stomach pain, fever higher than 100.4) Contact information: [address] [id number] mc [place] [address] [phone]               Allergies as of 09/28/2022   No Known Allergies      Medication List     STOP taking these medications    PrePLUS 27-1 MG Tabs       TAKE these medications    PRENATAL GUMMIES PO Take 2 tablets by mouth daily.        Katrinka Blazing, [place], CNM 09/28/2022, 7:12 PM  2/3

## 2022-09-29 ENCOUNTER — Telehealth: Payer: Self-pay

## 2022-09-29 ENCOUNTER — Encounter (HOSPITAL_BASED_OUTPATIENT_CLINIC_OR_DEPARTMENT_OTHER): Payer: Self-pay | Admitting: Obstetrics and Gynecology

## 2022-09-29 NOTE — Telephone Encounter (Signed)
Contacted patient to notify her of date, time and location for surgery. Patient was scheduled for 09/30/22 @[facility]  at 1pm. Patient is aware she must arrive by 11 am. Provided patient with pre-op instructions and surgery details were sent by letter via mychart.

## 2022-09-29 NOTE — Progress Notes (Signed)
Spoke w/ via phone for pre-op interview--- Yvonne [name] needs dos----    NONE,surgeon orders pending           [name] results------ COVID test -----patient states asymptomatic no test needed Arrive at -------1115 NPO after MN NO Solid Food.  Clear liquids from MN until---1015 Med rec completed Medications to take morning of surgery -----NONE Diabetic medication ----- Patient instructed no nail polish to be worn day of surgery Patient instructed to bring photo id and insurance card day of surgery Patient aware to have Driver (ride ) / caregiver  Yvonne [name]  for 24 hours after surgery  Patient Special Instructions ----- Pre-Op special Instructions ----- Patient verbalized understanding of instructions that were given at this phone interview. Patient denies shortness of breath, chest pain, fever, cough at this phone interview.

## 2022-09-30 ENCOUNTER — Ambulatory Visit (HOSPITAL_COMMUNITY)
Admission: RE | Admit: 2022-09-30 | Discharge: 2022-09-30 | Disposition: A | Discharge status: Home / Self Care | Payer: 59 | Source: Home / Self Care | Attending: Obstetrics and Gynecology | Admitting: Obstetrics and Gynecology

## 2022-09-30 ENCOUNTER — Ambulatory Visit (HOSPITAL_BASED_OUTPATIENT_CLINIC_OR_DEPARTMENT_OTHER): Payer: 59 | Admitting: Certified Registered Nurse Anesthetist

## 2022-09-30 ENCOUNTER — Encounter (HOSPITAL_BASED_OUTPATIENT_CLINIC_OR_DEPARTMENT_OTHER)
Admission: RE | Disposition: A | Discharge status: Home / Self Care | Payer: Self-pay | Source: Home / Self Care | Attending: Obstetrics and Gynecology

## 2022-09-30 ENCOUNTER — Encounter (HOSPITAL_BASED_OUTPATIENT_CLINIC_OR_DEPARTMENT_OTHER): Payer: Self-pay | Admitting: Obstetrics and Gynecology

## 2022-09-30 ENCOUNTER — Other Ambulatory Visit: Payer: Self-pay

## 2022-09-30 ENCOUNTER — Ambulatory Visit (HOSPITAL_BASED_OUTPATIENT_CLINIC_OR_DEPARTMENT_OTHER)
Admission: RE | Admit: 2022-09-30 | Discharge: 2022-10-01 | Disposition: A | Discharge status: Home / Self Care | Payer: 59 | Attending: Obstetrics and Gynecology | Admitting: Obstetrics and Gynecology

## 2022-09-30 DIAGNOSIS — O99340 Other mental disorders complicating pregnancy, unspecified trimester: Secondary | ICD-10-CM | POA: Diagnosis not present

## 2022-09-30 DIAGNOSIS — Z3A09 9 weeks gestation of pregnancy: Secondary | ICD-10-CM | POA: Diagnosis not present

## 2022-09-30 DIAGNOSIS — R58 Hemorrhage, not elsewhere classified: Secondary | ICD-10-CM | POA: Diagnosis present

## 2022-09-30 DIAGNOSIS — O021 Missed abortion: Secondary | ICD-10-CM

## 2022-09-30 DIAGNOSIS — F909 Attention-deficit hyperactivity disorder, unspecified type: Secondary | ICD-10-CM

## 2022-09-30 DIAGNOSIS — Z01818 Encounter for other preprocedural examination: Secondary | ICD-10-CM

## 2022-09-30 DIAGNOSIS — Q928 Other specified trisomies and partial trisomies of autosomes: Secondary | ICD-10-CM

## 2022-09-30 HISTORY — PX: OPERATIVE ULTRASOUND: SHX5996

## 2022-09-30 HISTORY — PX: DILATION AND EVACUATION: SHX1459

## 2022-09-30 LAB — CBC
HCT: 32.2 % — ABNORMAL LOW (ref 36.0–46.0)
Hemoglobin: 10.3 g/dL — ABNORMAL LOW (ref 12.0–15.0)
MCH: 27 pg (ref 26.0–34.0)
MCHC: 32 g/dL (ref 30.0–36.0)
MCV: 84.5 fL (ref 80.0–100.0)
Platelets: 157 10*3/uL (ref 150–400)
RBC: 3.81 MIL/uL — ABNORMAL LOW (ref 3.87–5.11)
RDW: 12.2 % (ref 11.5–15.5)
WBC: 8.1 10*3/uL (ref 4.0–10.5)
nRBC: 0 % (ref 0.0–0.2)

## 2022-09-30 SURGERY — DILATION AND EVACUATION, UTERUS
Anesthesia: General | Site: Vagina | Laterality: Bilateral

## 2022-09-30 MED ORDER — KETOROLAC TROMETHAMINE 30 MG/ML IJ SOLN
INTRAMUSCULAR | Status: AC
  Filled 2022-09-30: qty 1

## 2022-09-30 MED ORDER — DEXAMETHASONE SODIUM PHOSPHATE 10 MG/ML IJ SOLN
INTRAMUSCULAR | Status: DC | PRN
  Administered 2022-09-30: 10 mg via INTRAVENOUS

## 2022-09-30 MED ORDER — ONDANSETRON HCL 4 MG/2ML IJ SOLN
INTRAMUSCULAR | Status: DC | PRN
  Administered 2022-09-30: 4 mg via INTRAVENOUS

## 2022-09-30 MED ORDER — PROPOFOL 10 MG/ML IV BOLUS
INTRAVENOUS | Status: AC
  Filled 2022-09-30: qty 20

## 2022-09-30 MED ORDER — OXYCODONE HCL 5 MG PO TABS
ORAL_TABLET | ORAL | Status: AC
  Filled 2022-09-30: qty 1

## 2022-09-30 MED ORDER — FENTANYL CITRATE (PF) 250 MCG/5ML IJ SOLN
INTRAMUSCULAR | Status: DC | PRN
  Administered 2022-09-30 (×4): 25 ug via INTRAVENOUS
  Administered 2022-09-30: 50 ug via INTRAVENOUS
  Administered 2022-09-30 (×2): 25 ug via INTRAVENOUS

## 2022-09-30 MED ORDER — ONDANSETRON HCL 4 MG PO TABS: 4.0000 mg | ORAL_TABLET | Freq: Four times a day (QID) | ORAL | Status: DC | PRN

## 2022-09-30 MED ORDER — DOXYCYCLINE HYCLATE 100 MG IV SOLR
200.0000 mg | INTRAVENOUS | Status: AC
  Administered 2022-09-30: 200 mg via INTRAVENOUS
  Filled 2022-09-30: qty 200

## 2022-09-30 MED ORDER — DEXAMETHASONE SODIUM PHOSPHATE 10 MG/ML IJ SOLN
INTRAMUSCULAR | Status: AC
  Filled 2022-09-30: qty 1

## 2022-09-30 MED ORDER — METHYLERGONOVINE MALEATE 0.2 MG/ML IJ SOLN
INTRAMUSCULAR | Status: AC
  Filled 2022-09-30: qty 1

## 2022-09-30 MED ORDER — METHYLERGONOVINE MALEATE 0.2 MG PO TABS
0.2000 mg | ORAL_TABLET | Freq: Four times a day (QID) | ORAL | Status: DC
  Administered 2022-09-30 – 2022-10-01 (×2): 0.2 mg via ORAL
  Filled 2022-09-30 (×2): qty 1

## 2022-09-30 MED ORDER — ACETAMINOPHEN 500 MG PO TABS
ORAL_TABLET | ORAL | Status: AC
  Filled 2022-09-30: qty 2

## 2022-09-30 MED ORDER — LACTATED RINGERS IV SOLN: INTRAVENOUS | Status: DC

## 2022-09-30 MED ORDER — MIDAZOLAM HCL 2 MG/2ML IJ SOLN
INTRAMUSCULAR | Status: DC | PRN
  Administered 2022-09-30: 2 mg via INTRAVENOUS

## 2022-09-30 MED ORDER — OXYCODONE HCL 5 MG PO TABS: 5.0000 mg | ORAL_TABLET | Freq: Once | ORAL | Status: DC | PRN

## 2022-09-30 MED ORDER — LIDOCAINE-EPINEPHRINE 1 %-1:100000 IJ SOLN
INTRAMUSCULAR | Status: DC | PRN
  Administered 2022-09-30: 20 mL

## 2022-09-30 MED ORDER — IBUPROFEN 200 MG PO TABS: 600.0000 mg | ORAL_TABLET | Freq: Four times a day (QID) | ORAL | Status: DC

## 2022-09-30 MED ORDER — FENTANYL CITRATE (PF) 100 MCG/2ML IJ SOLN
INTRAMUSCULAR | Status: AC
  Filled 2022-09-30: qty 2

## 2022-09-30 MED ORDER — ACETAMINOPHEN 500 MG PO TABS
1000.0000 mg | ORAL_TABLET | ORAL | Status: AC
  Administered 2022-09-30: 1000 mg via ORAL

## 2022-09-30 MED ORDER — ONDANSETRON HCL 4 MG/2ML IJ SOLN
INTRAMUSCULAR | Status: AC
  Filled 2022-09-30: qty 2

## 2022-09-30 MED ORDER — DOCUSATE SODIUM 100 MG PO CAPS
100.0000 mg | ORAL_CAPSULE | Freq: Two times a day (BID) | ORAL | Status: DC
  Administered 2022-09-30 – 2022-10-01 (×2): 100 mg via ORAL

## 2022-09-30 MED ORDER — MIDAZOLAM HCL 2 MG/2ML IJ SOLN
INTRAMUSCULAR | Status: AC
  Filled 2022-09-30: qty 2

## 2022-09-30 MED ORDER — ALBUMIN HUMAN 5 % IV SOLN: INTRAVENOUS | Status: DC | PRN

## 2022-09-30 MED ORDER — LIDOCAINE HCL (PF) 2 % IJ SOLN
INTRAMUSCULAR | Status: AC
  Filled 2022-09-30: qty 5

## 2022-09-30 MED ORDER — OXYCODONE HCL 5 MG/5ML PO SOLN: 5.0000 mg | Freq: Once | ORAL | Status: DC | PRN

## 2022-09-30 MED ORDER — ONDANSETRON HCL 4 MG/2ML IJ SOLN
4.0000 mg | Freq: Four times a day (QID) | INTRAMUSCULAR | Status: DC | PRN
  Administered 2022-09-30: 4 mg via INTRAVENOUS

## 2022-09-30 MED ORDER — METHYLERGONOVINE MALEATE 0.2 MG/ML IJ SOLN
0.2000 mg | INTRAMUSCULAR | Status: AC
  Administered 2022-09-30: 0.2 mg via INTRAMUSCULAR

## 2022-09-30 MED ORDER — ACETAMINOPHEN 500 MG PO TABS
1000.0000 mg | ORAL_TABLET | Freq: Four times a day (QID) | ORAL | Status: DC
  Administered 2022-09-30 – 2022-10-01 (×3): 1000 mg via ORAL

## 2022-09-30 MED ORDER — LIDOCAINE 2% (20 MG/ML) 5 ML SYRINGE
INTRAMUSCULAR | Status: DC | PRN
  Administered 2022-09-30: 60 mg via INTRAVENOUS

## 2022-09-30 MED ORDER — FENTANYL CITRATE (PF) 100 MCG/2ML IJ SOLN: 25.0000 ug | INTRAMUSCULAR | Status: DC | PRN

## 2022-09-30 MED ORDER — POLYETHYLENE GLYCOL 3350 17 G PO PACK: 17.0000 g | PACK | Freq: Every day | ORAL | Status: DC | PRN

## 2022-09-30 MED ORDER — OXYCODONE HCL 5 MG PO TABS
5.0000 mg | ORAL_TABLET | ORAL | Status: DC | PRN
  Administered 2022-09-30 – 2022-10-01 (×4): 5 mg via ORAL

## 2022-09-30 MED ORDER — PROPOFOL 10 MG/ML IV BOLUS
INTRAVENOUS | Status: DC | PRN
  Administered 2022-09-30: 200 mg via INTRAVENOUS

## 2022-09-30 MED ORDER — ONDANSETRON HCL 4 MG/2ML IJ SOLN: 4.0000 mg | Freq: Four times a day (QID) | INTRAMUSCULAR | Status: DC | PRN

## 2022-09-30 MED ORDER — METHYLERGONOVINE MALEATE 0.2 MG/ML IJ SOLN
INTRAMUSCULAR | Status: DC | PRN
  Administered 2022-09-30: .2 mg via INTRAMUSCULAR

## 2022-09-30 MED ORDER — TRANEXAMIC ACID-NACL 1000-0.7 MG/100ML-% IV SOLN
INTRAVENOUS | Status: DC | PRN
  Administered 2022-09-30: 1000 mg via INTRAVENOUS

## 2022-09-30 MED ORDER — DOCUSATE SODIUM 100 MG PO CAPS
ORAL_CAPSULE | ORAL | Status: AC
  Filled 2022-09-30: qty 1

## 2022-09-30 MED ORDER — KETOROLAC TROMETHAMINE 30 MG/ML IJ SOLN
30.0000 mg | Freq: Four times a day (QID) | INTRAMUSCULAR | Status: DC
  Administered 2022-09-30 – 2022-10-01 (×3): 30 mg via INTRAVENOUS

## 2022-09-30 MED ORDER — 0.9 % SODIUM CHLORIDE (POUR BTL) OPTIME
TOPICAL | Status: DC | PRN
  Administered 2022-09-30: 500 mL

## 2022-09-30 MED ORDER — CEFAZOLIN SODIUM-DEXTROSE 2-4 GM/100ML-% IV SOLN
INTRAVENOUS | Status: AC
  Filled 2022-09-30: qty 100

## 2022-09-30 SURGICAL SUPPLY — 24 items
BALLN POSTPARTUM SOS BAKRI (BALLOONS)
BALLOON POSTPARTUM SOS BAKRI (BALLOONS) IMPLANT
FILTER UTR ASPR ASSEMBLY (MISCELLANEOUS) ×3 IMPLANT
GLOVE BIO SURGEON STRL SZ7 (GLOVE) ×3 IMPLANT
GLOVE BIOGEL PI IND STRL 7.0 (GLOVE) ×3 IMPLANT
GLOVE ECLIPSE 6.5 STRL STRAW (GLOVE) IMPLANT
GLOVE SURG SYN 6.5 ES PF (GLOVE) ×4 IMPLANT
GLOVE SURG SYN 6.5 PF PI (GLOVE) IMPLANT
GOWN STRL REUS W/ TWL LRG LVL3 (GOWN DISPOSABLE) IMPLANT
GOWN STRL REUS W/TWL LRG LVL3 (GOWN DISPOSABLE) ×4
GOWN STRL REUS W/TWL XL LVL3 (GOWN DISPOSABLE) ×3 IMPLANT
HOSE CONNECTING 18IN BERKELEY (TUBING) ×3 IMPLANT
KIT BERKELEY 1ST TRIMESTER 3/8 (MISCELLANEOUS) ×6 IMPLANT
KIT TURNOVER CYSTO (KITS) ×3 IMPLANT
NS IRRIG 500ML POUR BTL (IV SOLUTION) ×3 IMPLANT
PACK VAGINAL MINOR WOMEN LF (CUSTOM PROCEDURE TRAY) ×3 IMPLANT
PAD OB MATERNITY 4.3X12.25 (PERSONAL CARE ITEMS) ×3 IMPLANT
PAD PREP 24X48 CUFFED NSTRL (MISCELLANEOUS) ×3 IMPLANT
SET BERKELEY SUCTION TUBING (SUCTIONS) ×3 IMPLANT
SLEEVE SCD COMPRESS KNEE MED (STOCKING) ×3 IMPLANT
SYR 50ML LL SCALE MARK (SYRINGE) IMPLANT
TOWEL OR 17X24 6PK STRL BLUE (TOWEL DISPOSABLE) ×3 IMPLANT
TRAP TISSUE FILTER (MISCELLANEOUS) ×6 IMPLANT
VACURETTE 7MM CVD STRL WRAP (CANNULA) IMPLANT

## 2022-09-30 NOTE — Transfer of Care (Signed)
Immediate Anesthesia Transfer of Care Note  Patient: Yvonne [name]  Procedure(s) Performed: DILATATION AND EVACUATION with GENETIC STUDIES (ANORA) (Bilateral: Vagina ) OPERATIVE ULTRASOUND (Abdomen)  Patient Location: PACU  Anesthesia Type:General  Level of Consciousness: drowsy and patient cooperative  Airway & Oxygen Therapy: Patient Spontanous Breathing and Patient connected to nasal cannula oxygen  Post-op Assessment: Report given to RN and Post -op Vital signs reviewed and stable  Post vital signs: Reviewed and stable  Last Vitals:  Vitals Value Taken Time  BP    Temp    Pulse    Resp    SpO2      Last Pain:  Vitals:   09/30/22 1146  TempSrc: Oral  PainSc: 0-No pain      Patients Stated Pain Goal: 4 (09/30/22 1146)  Complications: No notable events documented.

## 2022-09-30 NOTE — Anesthesia Procedure Notes (Signed)
Procedure Name: LMA Insertion Date/Time: 09/30/2022 1:10 PM  Performed by: Dairl Ponder, CRNAPre-anesthesia Checklist: Patient identified, Emergency Drugs available, Suction available and Patient being monitored Patient Re-evaluated:Patient Re-evaluated prior to induction Oxygen Delivery Method: Circle System Utilized Preoxygenation: Pre-oxygenation with 100% oxygen Induction Type: IV induction Ventilation: Mask ventilation without difficulty LMA: LMA inserted LMA Size: 4.0 Number of attempts: 1 Airway Equipment and Method: Bite block Placement Confirmation: positive ETCO2 Tube secured with: Tape Dental Injury: Teeth and Oropharynx as per pre-operative assessment

## 2022-09-30 NOTE — Progress Notes (Signed)
Patient doing well talking on the phone.

## 2022-09-30 NOTE — H&P (Signed)
OB/GYN Pre-Op History and Physical  Yvonne [name] is a 32 y.o. G2P1001 presenting for missed abortion.       Past Medical History:  Diagnosis Date   ADHD (attention deficit hyperactivity disorder)    off meds since 2011   Chlamydia 02/2012   treated at [facility]   Vitamin D deficiency     Past Surgical History:  Procedure Laterality Date   CESAREAN SECTION N/A 12/29/2016   Procedure: CESAREAN SECTION;  Surgeon: Huel Cote, MD;  Location: [facility] BIRTHING SUITES;  Service: Obstetrics;  Laterality: N/A;    OB History  Gravida Para Term Preterm AB Living  2 1 1  0 0 1  SAB IAB Ectopic Multiple Live Births  0 0 0 0 1    # Outcome Date GA Lbr Len/2nd Weight Sex Delivery Anes PTL Lv  2 Current           1 Term 12/29/16 [redacted]w[redacted]d  2820 g M CS-LTranv Spinal  LIV    Social History   Socioeconomic History   Marital status: Single    Spouse name: Not on file   Number of children: Not on file   Years of education: Not on file   Highest education level: Not on file  Occupational History   Not on file  Tobacco Use   Smoking status: Never   Smokeless tobacco: Never  Vaping Use   Vaping Use: Never used  Substance and Sexual Activity   Alcohol use: Not Currently    Alcohol/week: 0.0 standard drinks of alcohol   Drug use: No   Sexual activity: Yes    Partners: Male  Other Topics Concern   Not on file  Social History Narrative   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  Lives with her mom, and her son (born 12/2016; the father isn't involved). Outside dog.   Brother lives in [place]   Social Determinants of Health   Financial Resource Strain: Not on file  Food Insecurity: Not on file  Transportation Needs: Not on file  Physical Activity: Not on file  Stress: Not on file  Social Connections: Not on file    Family History  Problem Relation Age of Onset   Diabetes Mother        pre-diabetic   Hypertension Mother    Diabetes Father    Hypertension Father     Cancer Father        leukemia in his 1's   Cancer Paternal Aunt        breast cancer   Drug abuse Paternal Uncle     Medications Prior to Admission  Medication Sig Dispense Refill Last Dose   Prenatal MV & Min w/FA-DHA (PRENATAL GUMMIES PO) Take 2 tablets by mouth daily.       No Known Allergies  Review of Systems: Negative except for what is mentioned in HPI.     Physical Exam: LMP 07/25/2022  CONSTITUTIONAL: Well-developed, well-nourished female in no acute distress.  HENT:  Normocephalic, atraumatic, External right and left ear normal. Oropharynx is clear and moist EYES: Conjunctivae and EOM are normal. Pupils are equal, round, and reactive to light. No scleral icterus.  NECK: Normal range of motion, supple, no masses SKIN: Skin is warm and dry. No rash noted. Not diaphoretic. No erythema. No pallor. NEUROLGIC: Alert and oriented to person, place, and time. Normal reflexes, muscle tone coordination. No cranial nerve deficit noted. PSYCHIATRIC: Normal mood and affect. Normal behavior. Normal judgment and thought content. RESPIRATORY: normal effort PELVIC:  Deferred MUSCULOSKELETAL: Normal range of motion. No edema and no tenderness. 2+ distal pulses.   Pertinent Labs/Studies:   Results for orders placed or performed during the hospital encounter of 09/28/22 (from the past 72 hour(s))  CBC     Status: None   Collection Time: 09/28/22  2:17 PM  Result Value Ref Range   WBC 6.6 4.0 - 10.5 K/uL   RBC 4.61 3.87 - 5.11 MIL/uL   Hemoglobin 12.1 12.0 - 15.0 g/dL   HCT 16.1 09.6 - 04.5 %   MCV 84.8 80.0 - 100.0 fL   MCH 26.2 26.0 - 34.0 pg   MCHC 30.9 30.0 - 36.0 g/dL   RDW 40.9 81.1 - 91.4 %   Platelets 224 150 - 400 K/uL   nRBC 0.0 0.0 - 0.2 %    Comment: Performed at [facility], [address], [place], [place] [address]       Assessment and Plan :Yvonne [name] is a 32 y.o. G2P1001 here for D&E for missed abortion.  Requests genetic studies (anora)  6wk  missed abortion    Lorriane Shire, M.D. Minimally Invasive Gynecologic Surgery and Pelvic Pain Specialist Attending Obstetrician & Gynecologist, Faculty Practice Center for [employer], [facility]

## 2022-09-30 NOTE — Anesthesia Preprocedure Evaluation (Signed)
Anesthesia Evaluation  Patient identified by MRN, date of birth, ID band Patient awake    Reviewed: Allergy & Precautions, H&P , NPO status , Patient's Chart, lab work & pertinent test results  Airway Mallampati: II   Neck ROM: full    Dental   Pulmonary neg pulmonary ROS   breath sounds clear to auscultation       Cardiovascular negative cardio ROS  Rhythm:regular Rate:Normal     Neuro/Psych        ADHD   GI/Hepatic   Endo/Other    Renal/GU      Musculoskeletal   Abdominal   Peds  Hematology   Anesthesia Other Findings   Reproductive/Obstetrics Missed abortion                             Anesthesia Physical Anesthesia Plan  ASA: 2  Anesthesia Plan: General   Post-op Pain Management:    Induction: Intravenous  PONV Risk Score and Plan: 3 and Ondansetron, Dexamethasone, Midazolam and Treatment may vary due to age or medical condition  Airway Management Planned: LMA  Additional Equipment:   Intra-op Plan:   Post-operative Plan: Extubation in OR  Informed Consent: I have reviewed the patients History and Physical, chart, labs and discussed the procedure including the risks, benefits and alternatives for the proposed anesthesia with the patient or authorized representative who has indicated his/her understanding and acceptance.     Dental advisory given  Plan Discussed with: CRNA, Anesthesiologist and Surgeon  Anesthesia Plan Comments:        Anesthesia Quick Evaluation

## 2022-09-30 NOTE — Op Note (Signed)
Yvonne [name] PROCEDURE DATE: 09/30/2022  PREOPERATIVE DIAGNOSIS: 6 week missed abortion POSTOPERATIVE DIAGNOSIS: 6 week missed abortion, hemorrhage PROCEDURE:     Dilation and Evacuation SURGEON:  Dr. Lorriane Shire  INDICATIONS: 32 y.o. G2P1001 with MAB at [redacted] weeks gestation, needing surgical completion.  Risks of surgery were discussed with the patient including but not limited to: bleeding which may require transfusion; infection which may require antibiotics; injury to uterus or surrounding organs; need for additional procedures including laparotomy or laparoscopy; possibility of intrauterine scarring which may impair future fertility; and other postoperative/anesthesia complications. Written informed consent was obtained.    FINDINGS:  A 6 week size uterus, moderate amounts of products of conception, specimen sent to pathology.  ANESTHESIA: General-LMA, paracervical block. INTRAVENOUS FLUIDS:  800 ml of LR ESTIMATED BLOOD LOSS:  800 ml SPECIMENS:  Products of conception sent to pathology and some products of conception were sent for [facility] genetic analysis COMPLICATIONS: hemorrhage  PROCEDURE DETAILS:  The patient received intravenous Doxycycline while in the preoperative area.  She was then taken to the operating room where general anesthesia was administered and was found to be adequate.  After an adequate timeout was performed, she was placed in the dorsal lithotomy position and examined; then prepped and draped in the sterile manner.   A vaginal speculum was then placed in the patient's vagina and a single tooth tenaculum was applied to the anterior lip of the cervix.  A paracervical block using 20 ml of 1% lidocaine with epinephrine was administered. The cervix was gently dilated to accommodate a 7 mm suction curette that was gently advanced to the uterine fundus. The suction device was then activated and curette slowly rotated to clear the uterus of products of conception.  While  emptying the contents of the uterus, there was brisk, bright bleeding coming from the uterus. After a few passes with significant blood loss, decision made to request intraoperative bedside US and methergine administered to the patient.  On bedside US the cavity was noted to have a small amount of tissue at the fundus, without flow, and the curette was advanced under US guidance. On bedside US there was no free fluid in the pelvis noted. Again, when the curette was removed there was continued brisk bleeding for a brief moment. The curette was passed under US guidance again and TXA given when blood loss noted to be ~500cc and assistance from Dr. Hyacinth Meeker requested. The curette was removed an bimanual massage was carried out. When this was completed, the US probe was used to visualize the uterus and the cavity showed that it had filled again and was clotting. The Bakri balloon was called for but ultimately decided to not place it as the uterine cavity had stopped filling and appeared to be clotted and there was no active bleeding from the os. After several minutes and no change in the uterine cavity nor bleeding from the os, decision made to conclude the procedure. There was minimal bleeding noted and the tenaculum removed with good hemostasis noted.   All instruments were removed from the patient's vagina.  Sponge and instrument counts were correct times two  The patient tolerated the procedure well and was taken to the recovery area extubated, awake, and in stable condition.   Lorriane Shire, MD Minimally Invasive Gynecologic Surgery and Chronic Pelvic Pain Specialist Obstetrics and Gynecology, [facility], [facility] 09/30/2022

## 2022-09-30 NOTE — Brief Op Note (Signed)
09/30/2022  2:36 PM  PATIENT:  Yvonne [name]  32 y.o. female  PRE-OPERATIVE DIAGNOSIS:  Missed ABORTION  POST-OPERATIVE DIAGNOSIS:  Missed ABORTION, hemorrhage  PROCEDURE:  Procedure(s): DILATATION AND EVACUATION with GENETIC STUDIES (ANORA) (Bilateral) OPERATIVE ULTRASOUND  SURGEON:  Surgeon(s) and Role:    Lorriane Shire, MD - Primary    * Jerene Bears, MD - Assisting  PHYSICIAN ASSISTANT: n/a  ASSISTANTS: Miller   ANESTHESIA:   general and paracervical block  EBL:  800 mL   BLOOD ADMINISTERED:none  DRAINS: none   LOCAL MEDICATIONS USED:  LIDOCAINE   SPECIMEN:  Source of Specimen:  products of conception   DISPOSITION OF SPECIMEN:  PATHOLOGY  COUNTS:  YES  TOURNIQUET:  * No tourniquets in log *  DICTATION: .Note written in EPIC  PLAN OF CARE: Admit for overnight observation  PATIENT DISPOSITION:  PACU - hemodynamically stable.   Delay start of Pharmacological VTE agent (>24hrs) due to surgical blood loss or risk of bleeding: not applicable

## 2022-10-01 ENCOUNTER — Encounter (HOSPITAL_BASED_OUTPATIENT_CLINIC_OR_DEPARTMENT_OTHER): Payer: Self-pay | Admitting: Obstetrics and Gynecology

## 2022-10-01 DIAGNOSIS — O021 Missed abortion: Secondary | ICD-10-CM

## 2022-10-01 LAB — SURGICAL PATHOLOGY

## 2022-10-01 MED ORDER — OXYCODONE HCL 5 MG PO TABS: 5.0000 mg | ORAL_TABLET | ORAL | 0 refills | Status: DC | PRN

## 2022-10-01 MED ORDER — DOCUSATE SODIUM 100 MG PO CAPS
ORAL_CAPSULE | ORAL | Status: AC
  Filled 2022-10-01: qty 1

## 2022-10-01 MED ORDER — OXYCODONE HCL 5 MG PO TABS
ORAL_TABLET | ORAL | Status: AC
  Filled 2022-10-01: qty 1

## 2022-10-01 MED ORDER — METHYLERGONOVINE MALEATE 0.2 MG PO TABS: 0.2000 mg | ORAL_TABLET | Freq: Four times a day (QID) | ORAL | 0 refills | Status: DC

## 2022-10-01 MED ORDER — IBUPROFEN 600 MG PO TABS: 600.0000 mg | ORAL_TABLET | Freq: Four times a day (QID) | ORAL | 0 refills | Status: DC

## 2022-10-01 MED ORDER — ACETAMINOPHEN 500 MG PO TABS
ORAL_TABLET | ORAL | Status: AC
  Filled 2022-10-01: qty 2

## 2022-10-01 MED ORDER — ACETAMINOPHEN 500 MG PO TABS: 1000.0000 mg | ORAL_TABLET | Freq: Four times a day (QID) | ORAL | 0 refills | Status: DC

## 2022-10-01 MED ORDER — KETOROLAC TROMETHAMINE 30 MG/ML IJ SOLN
INTRAMUSCULAR | Status: AC
  Filled 2022-10-01: qty 1

## 2022-10-01 NOTE — Progress Notes (Signed)
Gynecology Progress Note  Admission Date: 09/30/2022 Current Date: 10/01/2022 6:59 AM  Yvonne [name] is a 32 y.o. G2P1001 HD#2/POD#1 admitted for observation    History complicated by: Patient Active Problem List   Diagnosis Date Noted   Hemorrhage 09/30/2022   S/P primary low transverse C-section 12/29/2016   High risk sexual behavior 01/14/2016   Healthcare maintenance 01/14/2016   Vitamin D deficiency 06/29/2015    ROS and patient/family/surgical history, located on admission H&P note dated 09/30/2022, have been reviewed, and there are no changes except as noted below Yesterday/Overnight Events:  S/p D&E complicated by hemorrhage  Subjective:  Doing well this morning, minimal bleeding overnight, no dizziness, lightheadness and minimal cramping  Objective:   Vitals:   09/30/22 1745 09/30/22 2203 10/01/22 0200 10/01/22 0609  BP: (P) 111/68 114/69 108/61 (!) 92/50  Pulse:  (!) 102 82 88  Resp: (P) 16 16 16 20   Temp: (P) 99.3 F (37.4 C) 99.4 F (37.4 C) 98.2 F (36.8 C) 99.1 F (37.3 C)  TempSrc:      SpO2: (P) 100% 100% 100% 99%  Weight:      Height:        Temp:  [97.9 F (36.6 C)-99.5 F (37.5 C)] 99.1 F (37.3 C) (06/05 0609) Pulse Rate:  [71-102] 88 (06/05 0609) Resp:  [14-23] 20 (06/05 0609) BP: (92-133)/(50-83) 92/50 (06/05 0609) SpO2:  [97 %-100 %] 99 % (06/05 0609) Weight:  [67.1 kg] 67.1 kg (06/04 1146) I/O last 3 completed shifts: In: 1250 [P.O.:200; I.V.:800; IV Piggyback:250] Out: 1400 [Urine:600; Blood:800] Total I/O In: 368 [P.O.:368] Out: 1000 [Urine:1000]  Intake/Output Summary (Last 24 hours) at 10/01/2022 0659 Last data filed at 10/01/2022 5784 Gross per 24 hour  Intake 1618 ml  Output 2400 ml  Net -782 ml     Current Vital Signs 24h Vital Sign Ranges  T 99.1 F (37.3 C) Temp  Avg: 98.9 F (37.2 C)  Min: 97.9 F (36.6 C)  Max: 99.5 F (37.5 C)  BP (!) 92/50 BP  Min: 92/50  Max: 133/79  HR 88 Pulse  Avg: 90.9  Min: 71  Max: 102  RR  20 Resp  Avg: 18.3  Min: 14  Max: 23  SaO2 99 % Room Air SpO2  Avg: 99.4 %  Min: 97 %  Max: 100 %       24 Hour I/O Current Shift I/O  Time Ins Outs 06/04 0701 - 06/05 0700 In: 1618 [P.O.:568; I.V.:800] Out: 2400 [Urine:1600] 06/04 1901 - 06/05 0700 In: 368 [P.O.:368] Out: 1000 [Urine:1000]   Patient Vitals for the past 12 hrs:  BP Temp Pulse Resp SpO2  10/01/22 0609 (!) 92/50 99.1 F (37.3 C) 88 20 99 %  10/01/22 0200 108/61 98.2 F (36.8 C) 82 16 100 %  09/30/22 2203 114/69 99.4 F (37.4 C) (!) 102 16 100 %     Patient Vitals for the past 24 hrs:  BP Temp Temp src Pulse Resp SpO2 Height Weight  10/01/22 0609 (!) 92/50 99.1 F (37.3 C) -- 88 20 99 % -- --  10/01/22 0200 108/61 98.2 F (36.8 C) -- 82 16 100 % -- --  09/30/22 2203 114/69 99.4 F (37.4 C) -- (!) 102 16 100 % -- --  09/30/22 1745 (P) 111/68 (P) 99.3 F (37.4 C) -- -- (P) 16 (P) 100 % -- --  09/30/22 1637 -- 99.5 F (37.5 C) -- -- -- -- -- --  09/30/22 1600 117/71 99.3 F (37.4  C) -- 94 14 100 % -- --  09/30/22 1520 119/70 99.1 F (37.3 C) -- 93 15 100 % -- --  09/30/22 1500 123/78 98.7 F (37.1 C) -- 94 (!) 22 100 % -- --  09/30/22 1445 126/83 -- -- 92 (!) 21 97 % -- --  09/30/22 1430 133/79 -- -- 95 (!) 23 97 % -- --  09/30/22 1420 132/72 97.9 F (36.6 C) -- 98 (!) 22 100 % -- --  09/30/22 1146 115/69 98.4 F (36.9 C) Oral 71 16 100 % 5' (1.524 m) 67.1 kg    Physical exam: General appearance: alert, cooperative, and appears stated age Abdomen:  soft, minimally tender GU: scant old blood on pad from overnight Lungs:  normal respiratory effort Psych: appropriate Neurologic: Grossly normal  Medications Current Facility-Administered Medications  Medication Dose Route Frequency Provider Last Rate Last Admin   acetaminophen (TYLENOL) tablet 1,000 mg  1,000 mg Oral Q6H Blayre Papania, MD   1,000 mg at 10/01/22 9604   docusate sodium (COLACE) capsule 100 mg  100 mg Oral BID Lorriane Shire,  MD   100 mg at 10/01/22 5409   ketorolac (TORADOL) 30 MG/ML injection 30 mg  30 mg Intravenous Q6H Leilany Digeronimo, MD   30 mg at 10/01/22 8119   Followed by   ibuprofen (ADVIL) tablet 600 mg  600 mg Oral Q6H Ekaterina Denise, MD       lactated ringers infusion   Intravenous Continuous Katheline Brendlinger, MD       methylergonovine (METHERGINE) tablet 0.2 mg  0.2 mg Oral QID Bayla Mcgovern, MD   0.2 mg at 10/01/22 0403   ondansetron (ZOFRAN) tablet 4 mg  4 mg Oral Q6H PRN Lorriane Shire, MD       Or   ondansetron (ZOFRAN) injection 4 mg  4 mg Intravenous Q6H PRN Lazar Tierce, MD   4 mg at 09/30/22 1736   oxyCODONE (Oxy IR/ROXICODONE) immediate release tablet 5-10 mg  5-10 mg Oral Q4H PRN Lorriane Shire, MD   5 mg at 10/01/22 0207   polyethylene glycol (MIRALAX / GLYCOLAX) packet 17 g  17 g Oral Daily PRN Lorriane Shire, MD          Labs  Recent Labs  Lab 09/28/22 1417 09/30/22 1540  WBC 6.6 8.1  HGB 12.1 10.3*  HCT 39.1 32.2*  PLT 224 157    No results for input(s): "NA", "K", "CL", "CO2", "BUN", "CREATININE", "CALCIUM", "PROT", "BILITOT", "ALKPHOS", "ALT", "AST", "GLUCOSE" in the last 168 hours.  Invalid input(s): "LABALBU"    Assessment & Plan:   *GYN: minimal bleeding overnight *Pain: well controlled - APAP/NSAIDs oxy prn  *FEN/GI: regular diet *PPx: ambulation, SCDs *Dispo: home this AM  Code Status: Full Code    Lorriane Shire, MD Minimally Invasive Gynecologic Surgery Center for [facility] (Faculty Practice) 10/01/22 6:59 AM

## 2022-10-01 NOTE — Anesthesia Postprocedure Evaluation (Signed)
Anesthesia Post Note  Patient: Yvonne [name]  Procedure(s) Performed: DILATATION AND EVACUATION with GENETIC STUDIES (ANORA) (Bilateral: Vagina ) OPERATIVE ULTRASOUND (Abdomen)     Patient location during evaluation: PACU Anesthesia Type: General Level of consciousness: awake and alert Pain management: pain level controlled Vital Signs Assessment: post-procedure vital signs reviewed and stable Respiratory status: spontaneous breathing, nonlabored ventilation, respiratory function stable and patient connected to nasal cannula oxygen Cardiovascular status: blood pressure returned to baseline and stable Postop Assessment: no apparent nausea or vomiting Anesthetic complications: no   No notable events documented.  Last Vitals:  Vitals:   10/01/22 0609 10/01/22 0809  BP: (!) 92/50 102/62  Pulse: 88 84  Resp: 20 20  Temp: 37.3 C 37.1 C  SpO2: 99% 100%    Last Pain:  Vitals:   10/01/22 0809  TempSrc:   PainSc: 3                  Danesha Kirchoff S

## 2022-10-03 NOTE — Discharge Summary (Signed)
Physician Discharge Summary  Patient ID: Yvonne [name] MRN: [id number] DOB/AGE: Jun 12, 1990 32 y.o.  Admit date: 09/30/2022 Discharge date: 10/01/2022  Admission Diagnoses: hemorrhage  Discharge Diagnoses:  Principal Problem:   Hemorrhage Active Problems:   Missed abortion   Discharged Condition: stable  Hospital Course: patient presented for scheduled D&E for missed abortion at 6 weeks. Had an ~800 cc blood loss at the time of procedure. Treated with IM then po methergine as well as IV TXA. Prior to leaving the OR, there was no continued bleeding. Patient had scant bleeding overnight with normal vitals and asymptomatic from the acute blood loss and discharged home.   Consults: None  Significant Diagnostic Studies: labs:  Lab Results  Component Value Date   WBC 8.1 09/30/2022   HGB 10.3 (L) 09/30/2022   HCT 32.2 (L) 09/30/2022   MCV 84.5 09/30/2022   PLT 157 09/30/2022     Treatments: analgesia: acetaminophen and NSAIDs, oxycodone and methergine  Discharge Exam: Blood pressure 102/62, pulse 84, temperature 98.8 F (37.1 C), resp. rate 20, height 5' (1.524 m), weight 67.1 kg, last menstrual period 07/25/2022, SpO2 100 %. General appearance: alert, cooperative, and no distress Resp: clear to auscultation bilaterally Cardio: regular rate and rhythm, S1, S2 normal, no murmur, click, rub or gallop GI: soft, minimally tender suprapubic region Pelvic: external genitalia normal and scant blood on pad  Disposition: Discharge disposition: 01-Home or Self Care        Allergies as of 10/01/2022   No Known Allergies      Medication List     TAKE these medications    acetaminophen 500 MG tablet Commonly known as: TYLENOL Take 2 tablets (1,000 mg total) by mouth every 6 (six) hours. Notes to patient: Can take next dose at 12pm   ibuprofen 600 MG tablet Commonly known as: ADVIL Take 1 tablet (600 mg total) by mouth every 6 (six) hours. Notes to patient: Can take next  dose after 12pm   methylergonovine 0.2 MG tablet Commonly known as: METHERGINE Take 1 tablet (0.2 mg total) by mouth 4 (four) times daily. Notes to patient: Take next dose at 10am, then 4pm, and then 10pm   oxyCODONE 5 MG immediate release tablet Commonly known as: Oxy IR/ROXICODONE Take 1 tablet (5 mg total) by mouth every 4 (four) hours as needed for severe pain or breakthrough pain. Notes to patient: Can take next dose after 12:30pm   PRENATAL GUMMIES PO Take 2 tablets by mouth daily.         Signed: Lorriane Shire 10/03/2022, 10:16 AM

## 2022-10-08 ENCOUNTER — Ambulatory Visit: Payer: 59 | Admitting: Obstetrics and Gynecology

## 2022-10-09 ENCOUNTER — Encounter: Payer: Self-pay | Admitting: Obstetrics and Gynecology

## 2022-10-09 ENCOUNTER — Other Ambulatory Visit: Payer: Self-pay

## 2022-10-09 ENCOUNTER — Ambulatory Visit (INDEPENDENT_AMBULATORY_CARE_PROVIDER_SITE_OTHER): Payer: 59 | Admitting: Obstetrics and Gynecology

## 2022-10-09 ENCOUNTER — Encounter: Payer: Self-pay | Admitting: Family Medicine

## 2022-10-09 VITALS — BP 115/63 | HR 97 | Wt 147.5 lb

## 2022-10-09 DIAGNOSIS — Z09 Encounter for follow-up examination after completed treatment for conditions other than malignant neoplasm: Secondary | ICD-10-CM

## 2022-10-09 DIAGNOSIS — O034 Incomplete spontaneous abortion without complication: Secondary | ICD-10-CM | POA: Diagnosis not present

## 2022-10-09 DIAGNOSIS — Z3A01 Less than 8 weeks gestation of pregnancy: Secondary | ICD-10-CM

## 2022-10-09 NOTE — Progress Notes (Signed)
   POSTOPERATIVE VISIT NOTE   Subjective:     Yvonne [name] is a 32 y.o. G2P1001 who presents to the clinic 9 days status post  D&E for missed AB  for  missed abortion . Eating a regular diet without difficulty. Bowel movements are normal. The patient is not having any pain. Vaginal bleeding: has waxed and waned, passed a few small clots within the last few days but no heavy bleeding where she is soaking a pad in an hour Resumed sexual acitivity: no She is nervous about attempting to conceive again but is interested in conceiving Denies dizziness, lightheadedness and SOB  The following portions of the patient's history were reviewed and updated as appropriate: allergies, current medications, past family history, past medical history, past social history, past surgical history, and problem list..   Review of Systems Pertinent items are noted in HPI.    Objective:    BP 115/63   Pulse 97   Wt 147 lb 8 oz (66.9 kg)   LMP 07/25/2022   Breastfeeding Unknown   BMI 28.81 kg/m  General:  alert, cooperative, and no distress  Abdomen: soft, non-tender  Pelvic:   Exam deferred.    Pathology Results: FINAL MICROSCOPIC DIAGNOSIS:   A. PRODUCTS OF CONCEPTION:  -  Immature chorionic villi with hydropic change, consistent with  products of conception (specimen was sent for genetic studies, i.e.  Anora, to be reported separately).     Assessment:   Doing well postoperatively. Operative findings again reviewed. Pathology report discussed.   1. Postop check Doing well from postoperative standpoint   2. Incomplete miscarriage Resolved s/p D&E complicated by hemorrhage Reviewed pathology and awaiting ANORA testing    Plan:   1. Continue any current medications. 2. Follow up genetic studies 3. Activity restrictions: none 4. Anticipated return to work: now. 5. Regarding next pregnancy: recommend waiting until next normal menses prior   Lorriane Shire, MD Obstetrician &  Gynecologist, [facility] for [employer], [facility]

## 2022-10-14 ENCOUNTER — Other Ambulatory Visit: Payer: 59

## 2022-10-30 ENCOUNTER — Encounter (HOSPITAL_COMMUNITY): Payer: Self-pay

## 2022-10-30 ENCOUNTER — Inpatient Hospital Stay (HOSPITAL_COMMUNITY)
Admission: EM | Admit: 2022-10-30 | Discharge: 2022-10-31 | Disposition: A | Discharge status: Home / Self Care | Payer: 59 | Attending: Obstetrics and Gynecology | Admitting: Obstetrics and Gynecology

## 2022-10-30 DIAGNOSIS — O3680X0 Pregnancy with inconclusive fetal viability, not applicable or unspecified: Secondary | ICD-10-CM | POA: Insufficient documentation

## 2022-10-30 DIAGNOSIS — O209 Hemorrhage in early pregnancy, unspecified: Secondary | ICD-10-CM | POA: Insufficient documentation

## 2022-10-30 DIAGNOSIS — N898 Other specified noninflammatory disorders of vagina: Secondary | ICD-10-CM

## 2022-10-30 DIAGNOSIS — N939 Abnormal uterine and vaginal bleeding, unspecified: Secondary | ICD-10-CM | POA: Insufficient documentation

## 2022-10-30 DIAGNOSIS — Z3A00 Weeks of gestation of pregnancy not specified: Secondary | ICD-10-CM | POA: Insufficient documentation

## 2022-10-30 DIAGNOSIS — R109 Unspecified abdominal pain: Secondary | ICD-10-CM | POA: Insufficient documentation

## 2022-10-30 DIAGNOSIS — Z9889 Other specified postprocedural states: Secondary | ICD-10-CM | POA: Insufficient documentation

## 2022-10-30 DIAGNOSIS — Z8619 Personal history of other infectious and parasitic diseases: Secondary | ICD-10-CM | POA: Insufficient documentation

## 2022-10-30 LAB — ANORA MISCARRIAGE TEST - FRESH

## 2022-10-30 NOTE — MAU Note (Signed)
.  Yvonne [name] is a 32 y.o. at Unknown here in MAU reporting lower abd pain and vag bleeding post [facility] June 4th for missed AB. PT came from Main ED LMP: 07/25/22 Onset of complaint: June 4th Pain score: 4 Vitals:   10/30/22 2353 10/30/22 2356  BP:  128/73  Pulse: 81   Resp: 17   Temp: 98.2 F (36.8 C)   SpO2: 99%      FHT:n/a Lab orders placed from triage:

## 2022-10-31 ENCOUNTER — Other Ambulatory Visit: Payer: Self-pay

## 2022-10-31 ENCOUNTER — Encounter (HOSPITAL_COMMUNITY): Payer: Self-pay | Admitting: Family Medicine

## 2022-10-31 ENCOUNTER — Telehealth (INDEPENDENT_AMBULATORY_CARE_PROVIDER_SITE_OTHER): Payer: 59

## 2022-10-31 ENCOUNTER — Inpatient Hospital Stay (EMERGENCY_DEPARTMENT_HOSPITAL)
Admission: AD | Admit: 2022-10-31 | Discharge: 2022-10-31 | Disposition: A | Discharge status: Home / Self Care | Payer: 59 | Source: Home / Self Care | Attending: Family Medicine | Admitting: Family Medicine

## 2022-10-31 DIAGNOSIS — N898 Other specified noninflammatory disorders of vagina: Secondary | ICD-10-CM

## 2022-10-31 DIAGNOSIS — O209 Hemorrhage in early pregnancy, unspecified: Secondary | ICD-10-CM | POA: Insufficient documentation

## 2022-10-31 DIAGNOSIS — N760 Acute vaginitis: Secondary | ICD-10-CM

## 2022-10-31 DIAGNOSIS — B9689 Other specified bacterial agents as the cause of diseases classified elsewhere: Secondary | ICD-10-CM

## 2022-10-31 DIAGNOSIS — O3680X0 Pregnancy with inconclusive fetal viability, not applicable or unspecified: Secondary | ICD-10-CM | POA: Insufficient documentation

## 2022-10-31 DIAGNOSIS — R109 Unspecified abdominal pain: Secondary | ICD-10-CM | POA: Diagnosis not present

## 2022-10-31 DIAGNOSIS — Z8619 Personal history of other infectious and parasitic diseases: Secondary | ICD-10-CM | POA: Diagnosis not present

## 2022-10-31 DIAGNOSIS — O0281 Inappropriate change in quantitative human chorionic gonadotropin (hCG) in early pregnancy: Secondary | ICD-10-CM

## 2022-10-31 DIAGNOSIS — Z3A00 Weeks of gestation of pregnancy not specified: Secondary | ICD-10-CM | POA: Insufficient documentation

## 2022-10-31 DIAGNOSIS — N939 Abnormal uterine and vaginal bleeding, unspecified: Secondary | ICD-10-CM | POA: Diagnosis not present

## 2022-10-31 DIAGNOSIS — Z9889 Other specified postprocedural states: Secondary | ICD-10-CM | POA: Diagnosis not present

## 2022-10-31 DIAGNOSIS — Z3490 Encounter for supervision of normal pregnancy, unspecified, unspecified trimester: Secondary | ICD-10-CM

## 2022-10-31 LAB — URINALYSIS, ROUTINE W REFLEX MICROSCOPIC
Bilirubin Urine: NEGATIVE
Glucose, UA: NEGATIVE mg/dL
Ketones, ur: NEGATIVE mg/dL
Leukocytes,Ua: NEGATIVE
Nitrite: NEGATIVE
Protein, ur: NEGATIVE mg/dL
Specific Gravity, Urine: 1.02 (ref 1.005–1.030)
pH: 7 (ref 5.0–8.0)

## 2022-10-31 LAB — URINALYSIS, MICROSCOPIC (REFLEX): Bacteria, UA: NONE SEEN

## 2022-10-31 LAB — WET PREP, GENITAL
Sperm: NONE SEEN
Trich, Wet Prep: NONE SEEN
WBC, Wet Prep HPF POC: <10 (ref <10)
Yeast Wet Prep HPF POC: NONE SEEN

## 2022-10-31 LAB — PREGNANCY, URINE: Preg Test, Ur: POSITIVE — AB

## 2022-10-31 LAB — HCG, QUANTITATIVE, PREGNANCY: hCG, Beta Chain, Quant, S: 57 m[IU]/mL — ABNORMAL HIGH (ref <5)

## 2022-10-31 MED ORDER — METRONIDAZOLE 500 MG PO TABS: 500.0000 mg | ORAL_TABLET | Freq: Two times a day (BID) | ORAL | 0 refills | Status: DC

## 2022-10-31 NOTE — Telephone Encounter (Signed)
Yvonne [name] [birth year]  Patient Location: Home  Provider Location: MAU at [facility]  Patient called and verified her identity via birth date.  Patient agreeable to results via phone and was informed of bacterial vaginosis and positive UPT.  Patient informed that UPT after D&C should be negative and concern for new pregnancy.  Patient admits that she has engaged in sexual activity before Monday, but states that her partner used the withdrawal method. Patient given option to return to MAU for evaluation or have outpatient.  Patient desires outpatient management and appt scheduled for Tuesday at [facility].  Patient also informed that medication for BV sent to pharmacy on file. No other questions or concerns.  Cherre Robins MSN, CNM Advanced Practice Provider, Center for [facility]    **This visit was completed, in its entirety, via telehealth communications.  I personally spent >/=5 minutes on the phone providing recommendations, education, and guidance.**

## 2022-10-31 NOTE — MAU Provider Note (Addendum)
History     CSN: [id number]  Arrival date and time: 10/31/22 1512   Event Date/Time   First Provider Initiated Contact with Patient 10/31/22 1744      Chief Complaint  Patient presents with   Abdominal Pain   Spotting   Yvonne [name] , a  32 y.o. G3P1001 at Unknown presents to MAU with complaints of not being able to get her blood work done last night. She states that she is still having period like cramping but today started passing clots. She reports that she is 4 weeks post op from a D&E and states that she is unsure if she is pregnant or not. She was recently seen in MAU for cramping and bleeding and noted to have a positive UPT. She states that she has had unprotected sex since her D&E and states her partner uses the withdrawal method. Reports her pain is a 4/10 but is more concerned with the small clots that she is having.      Patient seen early this morning for similar complaints, but was unable to get blood work completed. She was scheduled for a HCG outpatient on Tuesday.     OB History     Gravida  3   Para  1   Term  1   Preterm  0   AB  0   Living  1      SAB  0   IAB  0   Ectopic  0   Multiple  0   Live Births  1           Past Medical History:  Diagnosis Date   ADHD (attention deficit hyperactivity disorder)    off meds since 2011   Chlamydia 02/2012   treated at [facility]   Vitamin D deficiency     Past Surgical History:  Procedure Laterality Date   CESAREAN SECTION N/A 12/29/2016   Procedure: CESAREAN SECTION;  Surgeon: Huel Cote, MD;  Location: [facility];  Service: Obstetrics;  Laterality: N/A;   DILATION AND EVACUATION Bilateral 09/30/2022   Procedure: DILATATION AND EVACUATION with GENETIC STUDIES (ANORA);  Surgeon: Lorriane Shire, MD;  Location: [facility];  Service: Gynecology;  Laterality: Bilateral;   OPERATIVE ULTRASOUND  09/30/2022   Procedure: OPERATIVE ULTRASOUND;  Surgeon:  Lorriane Shire, MD;  Location: [facility];  Service: Gynecology;;    Family History  Problem Relation Age of Onset   Diabetes Mother        pre-diabetic   Hypertension Mother    Diabetes Father    Hypertension Father    Cancer Father        leukemia in his 31's   Cancer Paternal Aunt        breast cancer   Drug abuse Paternal Uncle     Social History   Tobacco Use   Smoking status: Never   Smokeless tobacco: Never  Vaping Use   Vaping Use: Never used  Substance Use Topics   Alcohol use: Not Currently    Alcohol/week: 0.0 standard drinks of alcohol   Drug use: No    Allergies: No Known Allergies  Medications Prior to Admission  Medication Sig Dispense Refill Last Dose   acetaminophen (TYLENOL) 500 MG tablet Take 2 tablets (1,000 mg total) by mouth every 6 (six) hours. 60 tablet 0    ibuprofen (ADVIL) 600 MG tablet Take 1 tablet (600 mg total) by mouth every 6 (six) hours. 60 tablet 0  metroNIDAZOLE (FLAGYL) 500 MG tablet Take 1 tablet (500 mg total) by mouth 2 (two) times daily. 14 tablet 0    oxyCODONE (OXY IR/ROXICODONE) 5 MG immediate release tablet Take 1 tablet (5 mg total) by mouth every 4 (four) hours as needed for severe pain or breakthrough pain. (Patient not taking: Reported on 10/09/2022) 5 tablet 0     Review of Systems  Constitutional:  Negative for chills, fatigue and fever.  Eyes:  Negative for pain and visual disturbance.  Respiratory:  Negative for apnea, shortness of breath and wheezing.   Cardiovascular:  Negative for chest pain and palpitations.  Gastrointestinal:  Positive for abdominal pain. Negative for constipation, diarrhea, nausea and vomiting.  Genitourinary:  Positive for vaginal bleeding. Negative for difficulty urinating, dysuria, pelvic pain, vaginal discharge and vaginal pain.  Musculoskeletal:  Negative for back pain.  Neurological:  Negative for seizures, weakness and headaches.  Psychiatric/Behavioral:  Negative  for suicidal ideas.    Physical Exam   Blood pressure 116/69, pulse 92, temperature 98 F (36.7 C), resp. rate 18, height 5' (1.524 m), weight 69.6 kg, SpO2 100 %, unknown if currently breastfeeding.  Physical Exam Vitals and nursing note reviewed.  Constitutional:      General: She is not in acute distress.    Appearance: Normal appearance.  HENT:     Head: Normocephalic.  Pulmonary:     Effort: Pulmonary effort is normal.  Abdominal:     Palpations: Abdomen is soft.     Tenderness: There is no abdominal tenderness.  Musculoskeletal:     Cervical back: Normal range of motion.  Skin:    General: Skin is warm and dry.  Neurological:     Mental Status: She is alert and oriented to person, place, and time.  Psychiatric:        Mood and Affect: Mood normal.     MAU Course  Procedures Orders Placed This Encounter  Procedures   hCG, quantitative, pregnancy   Results for orders placed or performed during the hospital encounter of 10/31/22 (from the past 24 hour(s))  hCG, quantitative, pregnancy     Status: Abnormal   Collection Time: 10/31/22  5:03 PM  Result Value Ref Range   hCG, Beta Chain, Quant, S 57 (H) <5 mIU/mL    MDM - Quant Results were 57.  New pregnancy versus lingering hormone from missed AB.  - Plan for discharge.   Assessment and Plan  - 1. Elevated level of quantitative hCG for gestational age in early pregnancy   2. Pregnancy with gestation of unknown duration   3. Vaginal bleeding    - Recommendation for repeat quant in 48 hours. Patient to report back to MAU on Sunday afternoon. Patient agreeable to plan of care.  - Worsening signs and return precautions reviewed. - patient discharged home in stable condition and may return to MAU as needed.   - Patient scheduled for repeat quant on Sunday in MAU.   Claudette Head, MSN CNM  10/31/2022, 5:44 PM

## 2022-10-31 NOTE — MAU Note (Signed)
Yvonne [name] is a 32 y.o. at Unknown here in MAU reporting: she was seen in MAU last night but left prior to completion of evaluation.  States left prior to having lab work.  Reports she's having abdominal cramping and spotting when wiping. LMP: unsure Onset of complaint: yesterday Pain score: 6 Vitals:   10/31/22 1548  BP: 116/69  Pulse: 92  Resp: 18  Temp: 98 F (36.7 C)  SpO2: 100%     FHT:NA Lab orders placed from triage:   None

## 2022-10-31 NOTE — MAU Provider Note (Signed)
History     CSN: [id number]  Arrival date and time: 10/30/22 2215   Event Date/Time   First Provider Initiated Contact with Patient 10/31/22 0010      Chief Complaint  Patient presents with   Abdominal Pain   Vaginal Bleeding   Yvonne [name] is a 32 y.o. female who presents today, via [facility], for vaginal bleeding.  She reports she had a D&C on June 4 and states bleeding has not stopped.  She states that her follow-up on June 11 she was having dark brown spotting She states the bleeding continues to be dark brown overall, but sometimes is red or pink.   She states the bleeding is mostly noted with wiping, but is also noted in her underwear.   She does endorse foul odor but is unable to describe it.  Patient also reports  Monday she started to have cramping after intercourse with her partner.  She states the cramping got so bad that she had to take pain medication, but got relief after about 15 minutes.  Patient reports that this was her first sexual encounter since her D&C.  She states the cramping continues today and she rates her pain a 4/10.   She also reports some nausea.      Past Medical History:  Diagnosis Date   ADHD (attention deficit hyperactivity disorder)    off meds since 2011   Chlamydia 02/2012   treated at [facility]   Vitamin D deficiency     Past Surgical History:  Procedure Laterality Date   CESAREAN SECTION N/A 12/29/2016   Procedure: CESAREAN SECTION;  Surgeon: Huel Cote, MD;  Location: [facility];  Service: Obstetrics;  Laterality: N/A;   DILATION AND EVACUATION Bilateral 09/30/2022   Procedure: DILATATION AND EVACUATION with GENETIC STUDIES (ANORA);  Surgeon: Lorriane Shire, MD;  Location: [facility];  Service: Gynecology;  Laterality: Bilateral;   OPERATIVE ULTRASOUND  09/30/2022   Procedure: OPERATIVE ULTRASOUND;  Surgeon: Lorriane Shire, MD;  Location: [facility];  Service: Gynecology;;    Family  History  Problem Relation Age of Onset   Diabetes Mother        pre-diabetic   Hypertension Mother    Diabetes Father    Hypertension Father    Cancer Father        leukemia in his 37's   Cancer Paternal Aunt        breast cancer   Drug abuse Paternal Uncle     Social History   Tobacco Use   Smoking status: Never   Smokeless tobacco: Never  Vaping Use   Vaping Use: Never used  Substance Use Topics   Alcohol use: Not Currently    Alcohol/week: 0.0 standard drinks of alcohol   Drug use: No    Allergies: No Known Allergies  Medications Prior to Admission  Medication Sig Dispense Refill Last Dose   acetaminophen (TYLENOL) 500 MG tablet Take 2 tablets (1,000 mg total) by mouth every 6 (six) hours. 60 tablet 0    ibuprofen (ADVIL) 600 MG tablet Take 1 tablet (600 mg total) by mouth every 6 (six) hours. 60 tablet 0    methylergonovine (METHERGINE) 0.2 MG tablet Take 1 tablet (0.2 mg total) by mouth 4 (four) times daily. (Patient not taking: Reported on 10/09/2022) 3 tablet 0    oxyCODONE (OXY IR/ROXICODONE) 5 MG immediate release tablet Take 1 tablet (5 mg total) by mouth every 4 (four) hours as needed for severe pain or  breakthrough pain. (Patient not taking: Reported on 10/09/2022) 5 tablet 0    Prenatal MV & Min w/FA-DHA (PRENATAL GUMMIES PO) Take 2 tablets by mouth daily. (Patient not taking: Reported on 10/09/2022)       Review of Systems  Gastrointestinal:  Positive for abdominal pain and nausea. Negative for constipation, diarrhea and vomiting.  Genitourinary:  Positive for vaginal bleeding and vaginal discharge. Negative for difficulty urinating and dysuria.   Physical Exam   Blood pressure 128/73, pulse 81, temperature 98.2 F (36.8 C), resp. rate 17, height 5' (1.524 m), weight 69.4 kg, last menstrual period 07/25/2022, SpO2 99 %, unknown if currently breastfeeding.  Physical Exam Vitals reviewed.  Constitutional:      General: She is not in acute distress.     Appearance: She is well-developed. She is not ill-appearing.  HENT:     Head: Normocephalic and atraumatic.  Eyes:     Conjunctiva/sclera: Conjunctivae normal.  Cardiovascular:     Rate and Rhythm: Normal rate.  Pulmonary:     Effort: Pulmonary effort is normal. No respiratory distress.  Musculoskeletal:     Cervical back: Normal range of motion.  Neurological:     Mental Status: She is alert and oriented to person, place, and time.  Psychiatric:        Mood and Affect: Mood normal.        Behavior: Behavior normal.     MAU Course  Procedures Results for orders placed or performed during the hospital encounter of 10/30/22 (from the past 24 hour(s))  Wet prep, genital     Status: Abnormal   Collection Time: 10/31/22 12:10 AM   Specimen: Vaginal  Result Value Ref Range   Yeast Wet Prep HPF POC NONE SEEN NONE SEEN   Trich, Wet Prep NONE SEEN NONE SEEN   Clue Cells Wet Prep HPF POC PRESENT (A) NONE SEEN   WBC, Wet Prep HPF POC <10 <10   Sperm NONE SEEN   Urinalysis, Routine w reflex microscopic -Urine, Clean Catch     Status: Abnormal   Collection Time: 10/31/22 12:10 AM  Result Value Ref Range   Color, Urine YELLOW YELLOW   APPearance CLEAR CLEAR   Specific Gravity, Urine 1.020 1.005 - 1.030   pH 7.0 5.0 - 8.0   Glucose, UA NEGATIVE NEGATIVE mg/dL   Hgb urine dipstick LARGE (A) NEGATIVE   Bilirubin Urine NEGATIVE NEGATIVE   Ketones, ur NEGATIVE NEGATIVE mg/dL   Protein, ur NEGATIVE NEGATIVE mg/dL   Nitrite NEGATIVE NEGATIVE   Leukocytes,Ua NEGATIVE NEGATIVE  Pregnancy, urine     Status: Abnormal   Collection Time: 10/31/22 12:10 AM  Result Value Ref Range   Preg Test, Ur POSITIVE (A) NEGATIVE  Urinalysis, Microscopic (reflex)     Status: None   Collection Time: 10/31/22 12:10 AM  Result Value Ref Range   RBC / HPF 0-5 0 - 5 RBC/hpf   WBC, UA 0-5 0 - 5 WBC/hpf   Bacteria, UA NONE SEEN NONE SEEN   Squamous Epithelial / HPF 0-5 0 - 5 /HPF   Mucus PRESENT      MDM Labs UA, UPT, Cultures; wet prep and GC/CT Prescription Assessment and Plan  32 year old H/O D&C Vaginal Bleeding Abdominal Cramping  -POC reviewed. -Patient informed that her bleeding after D&C is not uncommon especially in passing of all blood such as stated above. -Discussed possibility of menstrual cycle returning. -Also discussed how infections can cause bleeding, spotting, and/or vaginal odor -Plan for  cultures to rule out.  Patient agreeable. -Discussed sending treatment, if appropriate, to pharmacy on file -Discussed following up at primary office for evaluation continued spotting.  Patient agreeable -Message to be sent to [facility].  Cherre Robins 10/31/2022, 12:10 AM   Reassessment (12:31 AM) -Patient reports cultures collected, but big blood clot passed. Bleeding stable currently.  -UA sent without UPT being collected. -Will order from lab. -Patient informed she will be contacted with results.  -Discharge to home in stable condition.   Cherre Robins MSN, CNM Advanced Practice Provider, Center for [employer]

## 2022-10-31 NOTE — Progress Notes (Signed)
Gerrit Heck CNM saw pt in Triage and discussed plan of care. Pt to BR to obtain urine for u/a and vag swab for wet prep. Pt then ok to go home and CNM will communicate thru My Chart. Pt agrees with plan of care.

## 2022-11-02 ENCOUNTER — Other Ambulatory Visit (HOSPITAL_COMMUNITY): Payer: 59 | Attending: Obstetrics & Gynecology

## 2022-11-02 ENCOUNTER — Inpatient Hospital Stay (HOSPITAL_COMMUNITY)
Admission: AD | Admit: 2022-11-02 | Discharge: 2022-11-02 | Disposition: A | Discharge status: Home / Self Care | Payer: 59 | Attending: Obstetrics & Gynecology | Admitting: Obstetrics & Gynecology

## 2022-11-02 ENCOUNTER — Encounter (HOSPITAL_COMMUNITY): Payer: Self-pay | Admitting: Obstetrics & Gynecology

## 2022-11-02 ENCOUNTER — Other Ambulatory Visit: Payer: Self-pay

## 2022-11-02 DIAGNOSIS — Z3A00 Weeks of gestation of pregnancy not specified: Secondary | ICD-10-CM

## 2022-11-02 DIAGNOSIS — O0281 Inappropriate change in quantitative human chorionic gonadotropin (hCG) in early pregnancy: Secondary | ICD-10-CM | POA: Insufficient documentation

## 2022-11-02 LAB — HCG, QUANTITATIVE, PREGNANCY: hCG, Beta Chain, Quant, S: 45 m[IU]/mL — ABNORMAL HIGH (ref <5)

## 2022-11-02 NOTE — MAU Note (Signed)
.  Yvonne [name] is a 32 y.o. at Unknown here in MAU reporting: presents for hcg recheck LMP: [place] Onset of complaint:  Pain score: 0/10 There were no vitals filed for this visit.    Lab orders placed from triage:   hcg

## 2022-11-02 NOTE — MAU Provider Note (Signed)
History    Event Date/Time   First Provider Initiated Contact with Patient 11/02/22 1959      Chief Complaint:  Labs Only (HCG recheck)   Yvonne [name] is  32 y.o. G2P1001 for F/U of elevated hCG level on 10/31/22. She had D&C for MAB on 09/30/22. Had not stopped bleeding since procedure. Had + UPT and bleeding. Has had intercourse w/ withdrawal for [facility] since D&C.   Since her last visit, the patient is without new complaint.     ROS Abdominal Pain: None Vaginal bleeding: spotting.   Passage of clots or tissue: None   A POS  Her previous Quantitative HCG values are:  Latest Reference Range & Units 10/31/22 17:03  HCG, Beta Chain, Quant, S <5 mIU/mL 57 (H)  (H): Data is abnormally high  Physical Exam   Patient Vitals for the past 24 hrs:  BP Temp Temp src Pulse Resp SpO2 Height Weight  11/02/22 2022 -- -- -- -- 18 -- -- --  11/02/22 1834 -- -- -- -- -- -- 5' (1.524 m) 70.3 kg  11/02/22 1833 123/77 98 F (36.7 C) Oral 80 18 100 % -- --   Constitutional: Well-nourished female in no apparent distress. No pallor Neuro: Alert and oriented 4 Cardiovascular: Normal rate Respiratory: Normal effort and rate Abdomen: Deferred Gynecological Exam: examination not indicated  Labs: Results for orders placed or performed during the hospital encounter of 11/02/22 (from the past 24 hour(s))  hCG, quantitative, pregnancy   Collection Time: 11/02/22  6:59 PM  Result Value Ref Range   hCG, Beta Chain, Quant, S 45 (H) <5 mIU/mL    MAU course/MDM: Quantitative hCG ordered  Drop in Quant. UNc;ear if this is left over from MAB in June or very early failing new pregnancy. [place] not obtained due to very low and dropping hCG levels, mild Sx. Hemodynamically stable.  Assessment: 1. Inappropriate change in quantitative human chorionic gonadotropin (hCG) in early pregnancy     Plan: Discharge home in stable condition. SAB and ectopic precautions Weekly hCgs until <5. Abstain until hCGs  <5 Start PNV since TTC  Follow-up Information     Center for [facility] Follow up on 11/07/2022.   Specialty: Obstetrics and Gynecology Why: For repeat blood work Contact information: [address] [place] [address] [phone]        [facility] Follow up.   Specialty: Obstetrics and Gynecology Why: As needed in emergencies Contact information: [address] [id number] Wilhemina Bonito [place] [address] [phone]               Allergies as of 11/02/2022   No Known Allergies      Medication List     STOP taking these medications    acetaminophen 500 MG tablet Commonly known as: TYLENOL   ibuprofen 600 MG tablet Commonly known as: ADVIL   oxyCODONE 5 MG immediate release tablet Commonly known as: Oxy IR/ROXICODONE       TAKE these medications    metroNIDAZOLE 500 MG tablet Commonly known as: FLAGYL Take 1 tablet (500 mg total) by mouth 2 (two) times daily.        Katrinka Blazing, [place], CNM 11/02/2022, 8:41 PM  2/3

## 2022-11-04 ENCOUNTER — Other Ambulatory Visit: Payer: 59

## 2022-11-05 ENCOUNTER — Telehealth: Payer: Self-pay

## 2022-11-05 DIAGNOSIS — R898 Other abnormal findings in specimens from other organs, systems and tissues: Secondary | ICD-10-CM

## 2022-11-05 NOTE — Telephone Encounter (Addendum)
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  This chromosomal abnormality is not comparable with fetal survival so this is why she had the miscarriage.  This type of chromosomal abnormality is common with miscarriages.  Genetic counseling is recommended.  If desires, please place order for pt.  Thanks.  Lum Keas, MD, covering for Dr. Briscoe Deutscher   Notified pt results and the recommendation for genetic counseling.  Pt verbalized that she would like to be scheduled for genetic counseling.  Pt scheduled for a virtual appt July 15 at 0845.    Leonette Nutting  11/05/22

## 2022-11-07 ENCOUNTER — Other Ambulatory Visit: Payer: 59

## 2022-11-07 DIAGNOSIS — O039 Complete or unspecified spontaneous abortion without complication: Secondary | ICD-10-CM

## 2022-11-08 LAB — BETA HCG QUANT (REF LAB): hCG Quant: 27 m[IU]/mL

## 2022-11-10 ENCOUNTER — Telehealth (HOSPITAL_BASED_OUTPATIENT_CLINIC_OR_DEPARTMENT_OTHER): Payer: 59 | Admitting: Obstetrics and Gynecology

## 2022-11-10 DIAGNOSIS — O021 Missed abortion: Secondary | ICD-10-CM | POA: Diagnosis not present

## 2022-11-10 DIAGNOSIS — R898 Other abnormal findings in specimens from other organs, systems and tissues: Secondary | ICD-10-CM

## 2022-11-10 NOTE — Progress Notes (Signed)
Virtual Visit via Video Note  I connected with Yvonne [name] on 11/10/22 at  9:00 AM EDT by a video enabled telemedicine application and verified that I am speaking with the correct person using two identifiers.  Location: Patient: home Provider: [facility]   Referring Provider:  Dr. Rayvon Char of Consultation: 20 minutes   Yvonne [name] was referred to [facility] for genetic counseling to discuss the result of the chromosomal microarray performed on her recent pregnancy loss which revealed trisomy 37. We also reviewed screening and testing options which would be available in any future pregnancy due to this history. This note summarizes the information we discussed with the patient, who was present on the virtual visit alone.   We reviewed that chromosomes are the inherited structures that contain our instructions for development (genes). Each cell of our body normally has 46 chromosomes, matched up into 23 pairs. The last pair determines our gender and are called the sex chromosomes. A female has an X and a Y chromosome, while a female has two X chromosomes. Rarely, when a mother's egg and father's sperm unite, an extra or missing chromosome can be passed on to the baby by mistake. Changes in the number or the structure of the chromosomes may result in a child with some degree of intellectual disabilities and physical problems.    Trisomy 16 is caused by having three copies (instead of the usual two copies) of the genes on chromosome number 16.The chromosomal microarray report from Anora testing on a products of conception sample dated 09/30/2022 showed the following results: arr(16)X3. No other copy number differences were noted on this report. The report also states parent of origin, which was maternal, indicating that the misdivision of chromosomes occurred in the egg, though it would not be expected to change the counseling in the absence of a familial translocation.     It is estimated that 45% of first trimester miscarriages are found to have a chromosome abnormality.  Trisomy 16 is among the most common chromosome differences seen in first trimester pregnancy losses and is not typically compatible with survival to term. It most often occurs due to nondisjunction, or abnormal separation, of the chromosomes during production of the egg or sperm. Trisomy may also occur as the result of a chromosome translocation in a parent, though this is much less common. The microarray results did not show any other imbalance to suggest involvement of other chromosomes which would indicate a higher chance for a translocation and there is no family history of recurrent pregnancy loss or anomalies. In the absence of a chromosome translocation, the recurrence for any aneuploidy for this couple is estimated to be 1% or the maternal age related risk.  Because Yvonne [name] is only 32 years old, we would estimate this risk of 1%.  Should she be greater than 8 years old at the time of any future pregnancy, then the risk would be equivalent to that of her age.  As a means of screening or testing in a future pregnancy, we discussed the following:   The most accurate noninvasive screening option for chromosome conditions is cell free fetal DNA testing. This test utilizes a maternal blood sample and DNA sequencing technology to isolate circulating cell free fetal DNA from maternal plasma. The fetal DNA can then be analyzed for DNA sequences that are derived from the three most common chromosomes involved in aneuploidy, chromosomes 13, 18, and 21. If the overall amount of DNA is  greater than the expected level for any of these chromosomes, aneuploidy is suspected. The detection rates are >99% for Down syndrome, >98% for trisomy 18 and >91% for Trisomy 13. While we do not consider it a replacement for invasive testing and karyotype analysis, a negative result from this testing would be reassuring,  though not a guarantee of a normal chromosome complement for the baby. An abnormal result may be suggestive of an abnormal chromosome complement, though we would still recommend CVS or amniocentesis to confirm any findings from this testing.  It is important to remember that this screening option will only assess for chromosomes 18, 13, 21, X and Y.  It cannot screen for all chromosome conditions.   Targeted ultrasound uses high frequency sound waves to create an image of the developing fetus. An ultrasound is often recommended as a routine means of evaluating the pregnancy. It is also used to screen for fetal anatomy problems (for example, a heart defect) that might be suggestive of a chromosomal or other abnormality. The detailed anatomy ultrasound is typically performed at 18-[redacted] weeks gestation.   The chorionic villus sampling procedure is available for first trimester chromosome analysis. This involves the withdrawal of a small amount of chorionic villi (tissue from the developing placenta). Risk of pregnancy loss is estimated to be less than 1 in 500. There is approximately a 1% (1 in 100) chance that the CVS chromosome results will be unclear. Chorionic villi cannot be tested for neural tube defects.    Amniocentesis involves the removal of a small amount of amniotic fluid from the sac surrounding the fetus with the use of a thin needle inserted through the maternal abdomen and uterus. Ultrasound guidance is used throughout the procedure. Fetal cells from amniotic fluid are directly evaluated and > 99.5% of chromosome problems and > 98% of open neural tube defects can be detected. This procedure is generally performed after the 15th week of pregnancy. The main risks to this procedure include complications leading to miscarriage in less than 1 in 500 cases.    Routine screening: Carrier screening. Per the ACOG Committee Opinion 691, all women who are considering a pregnancy or are currently pregnant  should be offered carrier screening for, at minimum, Cystic Fibrosis (CF), Spinal Muscular Atrophy (SMA), and Hemoglobinopathies The mode of inheritance, clinical manifestations of these conditions, as well as details about testing were reviewed. A negative result on carrier screening reduces the likelihood of being a carrier, however, does not entirely rule out the possibility. If Yvonne [name] was found to be a carrier for a specific condition, carrier screening for their reproductive partner would be recommended.    We obtained a detailed family history and pregnancy history.  This was the second pregnancy for Yvonne [name], the first with her current partner, Yvonne [name]. The miscarriage occurred at about [redacted] weeks gestation. Yvonne [name] has a healthy 71 year old son from a prior relationship and Yvonne [name] has 5 healthy children.  The family history was reported to be unremarkable for birth defects, intellectual delays, recurrent pregnancy loss or known chromosome abnormalities.    Plan of Care: The patient was encouraged to reach out in any upcoming pregnancy at which time we can plan for the desired testing at the appropriate gestational age.  If carrier screening is desired, the patient can let us know and we are happy to facilitate this testing prior to or during any future pregnancy.   We may be reached at [phone].Marland Kitchen     Antonietta Jewel.  Rochel Privett, MS, CGC       I provided 20 minutes of non-face-to-face time during this encounter.   Katrina Stack

## 2022-11-24 ENCOUNTER — Telehealth: Payer: Self-pay | Admitting: Family Medicine

## 2022-11-24 NOTE — Telephone Encounter (Signed)
Called patient stating I am trying to reach her to return her phone call. Discussed her last bhcg on 7/12 was 27; her bhcg levels are likely at 0 at this point so she should expect a period sometime in the next 4-6 weeks. Recommended she continue prenatal vitamins if she is trying to get pregnant again soon and to call us back for an appt if she doesn't start her period back. Patient verbalized understanding.

## 2022-11-24 NOTE — Telephone Encounter (Signed)
Patient state she had a D& C on June 3, she want to know when will her cycle start

## 2023-01-20 ENCOUNTER — Ambulatory Visit (INDEPENDENT_AMBULATORY_CARE_PROVIDER_SITE_OTHER): Payer: 59

## 2023-01-20 ENCOUNTER — Encounter (HOSPITAL_COMMUNITY): Payer: Self-pay | Admitting: *Deleted

## 2023-01-20 ENCOUNTER — Inpatient Hospital Stay (HOSPITAL_COMMUNITY)
Admission: AD | Admit: 2023-01-20 | Discharge: 2023-01-20 | Disposition: A | Discharge status: Home / Self Care | Payer: 59 | Attending: Obstetrics and Gynecology | Admitting: Obstetrics and Gynecology

## 2023-01-20 DIAGNOSIS — Z3200 Encounter for pregnancy test, result unknown: Secondary | ICD-10-CM | POA: Diagnosis not present

## 2023-01-20 DIAGNOSIS — N912 Amenorrhea, unspecified: Secondary | ICD-10-CM | POA: Insufficient documentation

## 2023-01-20 DIAGNOSIS — Z3201 Encounter for pregnancy test, result positive: Secondary | ICD-10-CM | POA: Diagnosis not present

## 2023-01-20 DIAGNOSIS — Z3490 Encounter for supervision of normal pregnancy, unspecified, unspecified trimester: Secondary | ICD-10-CM

## 2023-01-20 LAB — POCT PREGNANCY, URINE: Preg Test, Ur: POSITIVE — AB

## 2023-01-20 MED ORDER — ONDANSETRON 4 MG PO TBDP: 8.0000 mg | ORAL_TABLET | Freq: Once | ORAL | Status: DC

## 2023-01-20 NOTE — Progress Notes (Signed)
Possible Pregnancy  Here today to leave urine specimen for pregnancy confirmation. UPT in office today is positive. Pt reports first positive home UPT 3 days prior. Reviewed dating with patient:   LMP: 12/20/22 EDD: 09/26/22 4w 3d today  OB history reviewed; c-section in 2018 and miscarriage with D&C in June 2024. Reports 2 normal periods since that time. Did have a period of bleeding about 2 weeks ago that lasted 5-6 days that she does not believe was a period. Bleeding ranged from pink spotting to light bleeding. No pain or bleeding today.  Reviewed with Dan Humphreys, CNM who recommends dating [place] in 1-2 weeks follow by initiation of prenatal care. Pt would prefer OB/GYN for first visit, will forward to front office for scheduling.  Marjo Bicker, RN 01/20/2023  3:16 PM

## 2023-01-20 NOTE — MAU Note (Signed)
Yvonne [name] is a 32 y.o. at Unknown here in MAU reporting: took like 2 or 3 HPTs that were positive, just wanting to make sure.  No problems, no pain or bleeding.  LMP: 8/24 Onset of complaint: none Pain score: none Vitals:   01/20/23 1230  BP: 117/68  Pulse: 97  Resp: 17  Temp: 98.3 F (36.8 C)  SpO2: 100%     Lab orders placed from triage:

## 2023-01-20 NOTE — MAU Provider Note (Signed)
  S:   32 y.o. G2P1001 @Unknown  by LMP presents to MAU for pregnancy confirmation.  She denies abdominal pain or vaginal bleeding today.    O: BP 117/68 (BP Location: Right Arm)   Pulse 97   Temp 98.3 F (36.8 C) (Oral)   Resp 17   Ht 5' (1.524 m)   Wt 67.8 kg   LMP 12/20/2022   SpO2 100%   Breastfeeding Unknown   BMI 29.18 kg/m  Physical Examination: General appearance - alert, well appearing, and in no distress, oriented to person, place, and time and acyanotic, in no respiratory distress  No results found for this or any previous visit (from the past 48 hour(s)).  A: Missed menses  P: D/C home Informed pt we do not do pregnancy verifications in MAU Referred to [facility] for pregnancy test & verification Return to MAU as needed for pregnancy related emergencies  Venia Carbon, NP 12:32 PM

## 2023-01-26 ENCOUNTER — Telehealth: Payer: Self-pay | Admitting: *Deleted

## 2023-01-26 NOTE — Telephone Encounter (Signed)
Pt left VM message stating that she has craving to eat chalk.  She requests a call back. Per chart review, pt had +UPT on 9/24 and reported unsure LMP 12/20/22 which would make her [redacted]w[redacted]d EGA today. I called pt and discussed her concern. I advised that it's possible she has low iron level and recommended that she increase foods in her diet which are iron rich. She confirmed that she is taking PNV daily.  I advised that she may try munching on healthy snacks such as carrots in order to decrease craving for non-food items.  She voiced understanding.

## 2023-02-02 ENCOUNTER — Ambulatory Visit (INDEPENDENT_AMBULATORY_CARE_PROVIDER_SITE_OTHER): Payer: 59

## 2023-02-02 ENCOUNTER — Ambulatory Visit (INDEPENDENT_AMBULATORY_CARE_PROVIDER_SITE_OTHER): Payer: 59 | Admitting: Family Medicine

## 2023-02-02 ENCOUNTER — Encounter: Payer: Self-pay | Admitting: Family Medicine

## 2023-02-02 DIAGNOSIS — Z3A01 Less than 8 weeks gestation of pregnancy: Secondary | ICD-10-CM | POA: Diagnosis not present

## 2023-02-02 DIAGNOSIS — Z3490 Encounter for supervision of normal pregnancy, unspecified, unspecified trimester: Secondary | ICD-10-CM

## 2023-02-02 DIAGNOSIS — Z3201 Encounter for pregnancy test, result positive: Secondary | ICD-10-CM | POA: Diagnosis not present

## 2023-02-02 DIAGNOSIS — O26859 Spotting complicating pregnancy, unspecified trimester: Secondary | ICD-10-CM | POA: Diagnosis not present

## 2023-02-02 DIAGNOSIS — O3680X0 Pregnancy with inconclusive fetal viability, not applicable or unspecified: Secondary | ICD-10-CM | POA: Diagnosis not present

## 2023-02-02 LAB — BETA HCG QUANT (REF LAB): hCG Quant: 2374 m[IU]/mL

## 2023-02-02 NOTE — Progress Notes (Signed)
   Amenorrhea with positive UPT PROBLEM  VISIT ENCOUNTER NOTE  Subjective:   Glorianna Gott is a 32 y.o. G39P1011  female here for positive pregnancy test.   UPT was positive on 9/24 at MAU. Here today for viability/Dating [place]. Never had blood BHCG.  Would be 6 weeks by LMP.  She was told to have two regular periods after her miscarriage Reviewed images - no gestational sac seen. Having active bleeding now-- spotting only. Denies abdominal pain or cramping. She is nervous about a repeat miscarriage. She is s/p DC 6/4 but still has elevated BHCG as of 7/12.   She does reports two periods (bleeding events) after her miscarriage. She is certain of her last bleeding event.  Denies abnormal vaginal bleeding, discharge, pelvic pain, problems with intercourse or other gynecologic concerns.    Gynecologic History Patient's last menstrual period was 12/20/2022.  Health Maintenance Due  Topic Date Due   Hepatitis C Screening  Never done   Cervical Cancer Screening (HPV/Pap Cotest)  03/10/2021   INFLUENZA VACCINE  11/27/2022   COVID-19 Vaccine (1 - 2023-24 season) Never done    The following portions of the patient's history were reviewed and updated as appropriate: allergies, current medications, past family history, past medical history, past social history, past surgical history and problem list.  Review of Systems Pertinent items are noted in HPI.   Objective:  LMP 12/20/2022  Gen: well appearing, NAD HEENT: no scleral icterus CV: RR Lung: Normal WOB Ext: warm well perfused   Assessment and Plan:   1. Positive pregnancy test I think this patient might be much earlier pregnant. She still had a positive BHCG after her D&C in [facility] July.  She was clear that her last bleeding event was 8/24. No serial BHCG were done prior to today  I recommended stat bHCG today and Wed to determine trend. Possible repeat US after these beta vs other management if concern for ectopic.   - Beta hCG quant  (ref lab)  2. Spotting in early pregnancy Unsure if this is miscarriage vs pregnancy uncertain location  3. Pregnancy of unknown anatomic location If elevated BHCG and in appropriate rise there would be concern for ectopic. Given strict MAU precautions.    Please refer to After Visit Summary for other counseling recommendations.   Return in about 2 days (around 02/04/2023) for needs repeat Stat BHCG 10/9.  Federico Flake, MD, MPH, ABFM Attending Physician Faculty Practice- Center for [facility]

## 2023-02-04 ENCOUNTER — Ambulatory Visit (INDEPENDENT_AMBULATORY_CARE_PROVIDER_SITE_OTHER): Payer: 59 | Admitting: *Deleted

## 2023-02-04 VITALS — BP 112/74 | HR 93 | Ht 60.0 in | Wt 151.1 lb

## 2023-02-04 DIAGNOSIS — O26859 Spotting complicating pregnancy, unspecified trimester: Secondary | ICD-10-CM | POA: Diagnosis not present

## 2023-02-04 DIAGNOSIS — O3680X0 Pregnancy with inconclusive fetal viability, not applicable or unspecified: Secondary | ICD-10-CM | POA: Diagnosis not present

## 2023-02-04 DIAGNOSIS — Z3A01 Less than 8 weeks gestation of pregnancy: Secondary | ICD-10-CM

## 2023-02-04 LAB — BETA HCG QUANT (REF LAB): hCG Quant: 3477 m[IU]/mL

## 2023-02-04 NOTE — Progress Notes (Cosign Needed Addendum)
Pt presents for stat BHCG.  She reports having vaginal spotting only. She denies abdominal pain. Pt was advised that she will be called later today with test results and next steps in care. She stated that a detailed message can be left on voicemail if she does not answer. She was instructed to go to MAU if she develops increased vaginal bleeding or abdominal pain.   1330  BHCG results (3477) reviewed by Dr. Alvester Morin who finds this is a 68% rise. She recommends repeat stat BHCG on 10/11. I called pt and informed her of test results as well as next step in care. She voiced understanding and agreed to appt on 10/11.

## 2023-02-06 ENCOUNTER — Ambulatory Visit (INDEPENDENT_AMBULATORY_CARE_PROVIDER_SITE_OTHER): Payer: 59

## 2023-02-06 VITALS — BP 108/66 | HR 86 | Ht 60.0 in | Wt 151.9 lb

## 2023-02-06 DIAGNOSIS — O3680X0 Pregnancy with inconclusive fetal viability, not applicable or unspecified: Secondary | ICD-10-CM | POA: Diagnosis not present

## 2023-02-06 DIAGNOSIS — Z3A00 Weeks of gestation of pregnancy not specified: Secondary | ICD-10-CM

## 2023-02-06 LAB — BETA HCG QUANT (REF LAB): hCG Quant: 4801 m[IU]/mL

## 2023-02-06 NOTE — Progress Notes (Signed)
Patient here for stat beta hcg. Patient informed that she will receive a call today regarding her results. Patient denies any vaginal bleeding and/or abdominal pain. Patient is unsure of LMP but reports it was possibly in August. Advised patient to go to the MAU if she experiences heavy bleeding and/or severe abdominal pain. Patient verbalized understanding and all questions were answered.   1220 Notified Dr. Alysia Penna to call patient regarding results due to office closing.  Marcelino Duster, RN

## 2023-02-09 ENCOUNTER — Telehealth: Payer: Self-pay | Admitting: Family Medicine

## 2023-02-09 DIAGNOSIS — O3680X0 Pregnancy with inconclusive fetal viability, not applicable or unspecified: Secondary | ICD-10-CM

## 2023-02-09 NOTE — Telephone Encounter (Signed)
I called Yvonne [name] and informed her of results and recommendations per Dr. Crissie Reese. I explained [place] is only available in our office Wed/ Thurs. And offered her a Thursday appointment. She states she cannot come them and I offered her a Monday 10/21 appointment which she agreed to. I reviewed ectopic precautions with her. She voices understanding. [name]

## 2023-02-09 NOTE — Telephone Encounter (Signed)
Following up on inbox result.  Likely normal pregnancy given hcg trend (46% rise followed by 38% rise) but patient should have [place] scheduled ideally sometime this week and ectopic precautions reviewed.

## 2023-02-09 NOTE — Telephone Encounter (Signed)
Patient called in regarding her blood work results.

## 2023-02-12 DIAGNOSIS — Z3482 Encounter for supervision of other normal pregnancy, second trimester: Secondary | ICD-10-CM | POA: Diagnosis not present

## 2023-02-12 DIAGNOSIS — Z3483 Encounter for supervision of other normal pregnancy, third trimester: Secondary | ICD-10-CM | POA: Diagnosis not present

## 2023-02-16 ENCOUNTER — Ambulatory Visit (INDEPENDENT_AMBULATORY_CARE_PROVIDER_SITE_OTHER): Payer: 59 | Admitting: Obstetrics and Gynecology

## 2023-02-16 ENCOUNTER — Ambulatory Visit (INDEPENDENT_AMBULATORY_CARE_PROVIDER_SITE_OTHER): Payer: 59

## 2023-02-16 ENCOUNTER — Other Ambulatory Visit: Payer: Self-pay

## 2023-02-16 ENCOUNTER — Telehealth: Payer: Self-pay | Admitting: Obstetrics and Gynecology

## 2023-02-16 VITALS — BP 125/79 | HR 106

## 2023-02-16 DIAGNOSIS — Z3490 Encounter for supervision of normal pregnancy, unspecified, unspecified trimester: Secondary | ICD-10-CM

## 2023-02-16 DIAGNOSIS — Z3A01 Less than 8 weeks gestation of pregnancy: Secondary | ICD-10-CM | POA: Diagnosis not present

## 2023-02-16 DIAGNOSIS — O3680X0 Pregnancy with inconclusive fetal viability, not applicable or unspecified: Secondary | ICD-10-CM

## 2023-02-16 LAB — BETA HCG QUANT (REF LAB): hCG Quant: 10963 m[IU]/mL

## 2023-02-16 NOTE — Telephone Encounter (Signed)
Phone call

## 2023-02-19 NOTE — Progress Notes (Signed)
   GYNECOLOGY PROGRESS NOTE  History:  32 y.o. G3P1011 presents to [facility] for ultrasound follow up  Not currently bleeding, denies cramping, or any vaginal symptoms. Unsure LMP  The following portions of the patient's history were reviewed and updated as appropriate: allergies, current medications, past family history, past medical history, past social history, past surgical history and problem list. Last pap smear on   Health Maintenance Due  Topic Date Due   Hepatitis C Screening  Never done   Cervical Cancer Screening (HPV/Pap Cotest)  03/10/2021   INFLUENZA VACCINE  11/27/2022   COVID-19 Vaccine (1 - 2023-24 season) Never done     Review of Systems:  Pertinent items are noted in HPI.   Objective:  Physical Exam Blood pressure 125/79, pulse (!) 106, last menstrual period 12/20/2022, unknown if currently breastfeeding. VS reviewed, nursing note reviewed,  Constitutional: well developed, well nourished, no distress HEENT: normocephalic CV: normal rate Pulm/chest wall: normal effort Breast Exam: deferred Abdomen: soft Neuro: alert and oriented  Skin: warm, dry Psych: affect normal Pelvic exam: deferred  Assessment & Plan:  1. Pregnancy with uncertain dates, antepartum Discussed u/s with Dr. Alvester Morin. Progression from last ultrasound now with a confirmed IUP, no fetal poles. Discussed results with patient, cannot determine viable pregnancy today. Precautions reviewed when to go to MAU. Discussed HCG today and have follow up u/s in ten days as well as provider visit to reassess for fetal poles at that point and discuss plan.   - US OB LESS THAN 14 WEEKS W/ OB TRANSVAGINAL AND DOPPLER; Future - Beta hCG quant (ref lab)   Return Follow up with Dr. Alvester Morin 10 days and schedule u/s in ten days.  Future Appointments  Date Time Provider Department Center  02/25/2023  2:15 PM [facility] US2 [facility]  02/25/2023  3:15 PM Federico Flake, MD [facility]    Albertine Grates, FNP

## 2023-02-22 ENCOUNTER — Inpatient Hospital Stay (HOSPITAL_COMMUNITY)
Admission: AD | Admit: 2023-02-22 | Discharge: 2023-02-22 | Disposition: A | Discharge status: Home / Self Care | Payer: 59 | Attending: Obstetrics and Gynecology | Admitting: Obstetrics and Gynecology

## 2023-02-22 ENCOUNTER — Inpatient Hospital Stay (HOSPITAL_COMMUNITY): Payer: 59

## 2023-02-22 DIAGNOSIS — O3680X0 Pregnancy with inconclusive fetal viability, not applicable or unspecified: Secondary | ICD-10-CM | POA: Diagnosis not present

## 2023-02-22 DIAGNOSIS — O30041 Twin pregnancy, dichorionic/diamniotic, first trimester: Secondary | ICD-10-CM | POA: Insufficient documentation

## 2023-02-22 DIAGNOSIS — Z3A01 Less than 8 weeks gestation of pregnancy: Secondary | ICD-10-CM | POA: Insufficient documentation

## 2023-02-22 DIAGNOSIS — O30049 Twin pregnancy, dichorionic/diamniotic, unspecified trimester: Secondary | ICD-10-CM

## 2023-02-22 DIAGNOSIS — O209 Hemorrhage in early pregnancy, unspecified: Secondary | ICD-10-CM | POA: Insufficient documentation

## 2023-02-22 LAB — URINALYSIS, ROUTINE W REFLEX MICROSCOPIC
Bilirubin Urine: NEGATIVE
Glucose, UA: NEGATIVE mg/dL
Ketones, ur: NEGATIVE mg/dL
Leukocytes,Ua: NEGATIVE
Nitrite: NEGATIVE
Protein, ur: NEGATIVE mg/dL
Specific Gravity, Urine: 1.025 (ref 1.005–1.030)
pH: 6.5 (ref 5.0–8.0)

## 2023-02-22 LAB — URINALYSIS, MICROSCOPIC (REFLEX): RBC / HPF: >50 RBC/hpf (ref 0–5)

## 2023-02-22 NOTE — MAU Note (Signed)
.  Yvonne [name] is a 32 y.o. at Unknown here in MAU reporting: Pt reports that around 5pm she went to the restroom she heard a "Plop" what maybe was a blood clot fall in the toilet but unsure. Pt states it did not look like a blood clot. Pt states she is not wearing a pad but when she wipes she sees spots of blood. No recent intercourse.   Twins and unsure of LMP but does receive care at [facility]. Next appointment Wednesday.   Pt states last time she was told she was 5w on the 21st of October.    Pain score: No pain.  Vitals:   02/22/23 2129  BP: 119/64  Resp: 17  Temp: 98.2 F (36.8 C)  SpO2: 100%     FHT: Lab orders placed from triage:   UA

## 2023-02-22 NOTE — MAU Provider Note (Signed)
History     CSN: [id number]  Arrival date and time: 02/22/23 2112    Chief Complaint  Patient presents with   Vaginal Bleeding   Vaginal Bleeding Pertinent negatives include no abdominal pain, back pain, chills, diarrhea, dysuria, fever, flank pain, nausea, rash, sore throat or vomiting.   Yvonne [name]  is 32 y.o. [id number] [redacted]w[redacted]d here with complaints of possible vaginal bleeding. She was peeing and heard a plop and had some light bleeding when she wiped. No bleeding currently. She recently when to the bathroom and now no spotting. No pink discharge. She is being followed outpatient for a pregnancy of uncertain viability.    OB History     Gravida  3   Para  1   Term  1   Preterm  0   AB  1   Living  1      SAB  1   IAB  0   Ectopic  0   Multiple  0   Live Births  1           Past Medical History:  Diagnosis Date   ADHD (attention deficit hyperactivity disorder)    off meds since 2011   Chlamydia 02/2012   treated at [facility]   Vitamin D deficiency     Past Surgical History:  Procedure Laterality Date   CESAREAN SECTION N/A 12/29/2016   Procedure: CESAREAN SECTION;  Surgeon: Huel Cote, MD;  Location: [facility] BIRTHING SUITES;  Service: Obstetrics;  Laterality: N/A;   DILATION AND EVACUATION Bilateral 09/30/2022   Procedure: DILATATION AND EVACUATION with GENETIC STUDIES (ANORA);  Surgeon: Lorriane Shire, MD;  Location: [facility];  Service: Gynecology;  Laterality: Bilateral;   OPERATIVE ULTRASOUND  09/30/2022   Procedure: OPERATIVE ULTRASOUND;  Surgeon: Lorriane Shire, MD;  Location: [facility];  Service: Gynecology;;    Family History  Problem Relation Age of Onset   Diabetes Mother        pre-diabetic   Hypertension Mother    Diabetes Father    Hypertension Father    Cancer Father        leukemia in his 10's   Cancer Paternal Aunt        breast cancer   Drug abuse Paternal Uncle     Social  History   Tobacco Use   Smoking status: Never   Smokeless tobacco: Never  Vaping Use   Vaping status: Never Used  Substance Use Topics   Alcohol use: Not Currently    Alcohol/week: 0.0 standard drinks of alcohol   Drug use: No    Allergies: No Known Allergies  Medications Prior to Admission  Medication Sig Dispense Refill Last Dose   Prenatal Vit-Fe Fumarate-FA (PRENATAL VITAMINS) 27-0.8 MG TABS Take 1 tablet by mouth daily.       Review of Systems  Constitutional:  Negative for chills and fever.  HENT:  Negative for congestion and sore throat.   Eyes:  Negative for pain and visual disturbance.  Respiratory:  Negative for cough, chest tightness and shortness of breath.   Cardiovascular:  Negative for chest pain.  Gastrointestinal:  Negative for abdominal pain, diarrhea, nausea and vomiting.  Endocrine: Negative for cold intolerance and heat intolerance.  Genitourinary:  Positive for vaginal bleeding. Negative for dysuria and flank pain.  Musculoskeletal:  Negative for back pain.  Skin:  Negative for rash.  Allergic/Immunologic: Negative for food allergies.  Neurological:  Negative for dizziness and light-headedness.  Psychiatric/Behavioral:  Negative for  agitation.    Physical Exam   Blood pressure 119/64, temperature 98.2 F (36.8 C), temperature source Oral, resp. rate 17, height 5' (1.524 m), weight 70 kg, last menstrual period 12/20/2022, SpO2 100%, unknown if currently breastfeeding.  Physical Exam Vitals and nursing note reviewed.  Constitutional:      Appearance: Normal appearance.  HENT:     Head: Normocephalic and atraumatic.     Nose: Nose normal.     Mouth/Throat:     Mouth: Mucous membranes are moist.  Eyes:     Conjunctiva/sclera: Conjunctivae normal.  Cardiovascular:     Rate and Rhythm: Normal rate.  Pulmonary:     Effort: Pulmonary effort is normal.  Abdominal:     General: Abdomen is flat.     Palpations: Abdomen is soft.  Musculoskeletal:      Cervical back: Normal range of motion.  Skin:    General: Skin is warm.     Capillary Refill: Capillary refill takes less than 2 seconds.  Neurological:     General: No focal deficit present.     Mental Status: She is alert.  Psychiatric:        Mood and Affect: Mood normal.     MAU Course  Procedures  MDM- Moderate Reviewed MAU visits and also outpatient management Reviewed labs/bhcg trends and past [place] results  Ordered repeat US given we are still trying to assess viability. [place] today shows non-progression of Twin B (no yolk). Twin A now has a yolk but no fetal pole yet. Not definitive for non viable pregnancy at this point.    Assessment and Plan   1. Bleeding in early pregnancy   2. Dichorionic diamniotic twin pregnancy, antepartum   3. [redacted] weeks gestation of pregnancy    - Discussed rescheduling her 10/30 appointments to the follow week (11/6 or after) - needs to have viability in 10-14 days from now - Reviewed bleeding precautions.   Future Appointments  Date Time Provider Department Center  02/25/2023  2:15 PM [facility] US2 [facility]  02/25/2023  3:15 PM Federico Flake, MD [facility]    Allergies as of 02/22/2023   No Known Allergies      Medication List     TAKE these medications    Prenatal Vitamins 27-0.8 MG Tabs Take 1 tablet by mouth daily.         Isa Rankin [facility] 02/22/2023, 11:15 PM

## 2023-02-24 ENCOUNTER — Inpatient Hospital Stay (HOSPITAL_COMMUNITY)
Admission: AD | Admit: 2023-02-24 | Discharge: 2023-02-25 | Disposition: A | Discharge status: Home / Self Care | Payer: 59 | Attending: Obstetrics and Gynecology | Admitting: Obstetrics and Gynecology

## 2023-02-24 ENCOUNTER — Inpatient Hospital Stay (HOSPITAL_COMMUNITY): Payer: 59

## 2023-02-24 DIAGNOSIS — O209 Hemorrhage in early pregnancy, unspecified: Secondary | ICD-10-CM

## 2023-02-24 DIAGNOSIS — O318X12 Other complications specific to multiple gestation, first trimester, fetus 2: Secondary | ICD-10-CM | POA: Diagnosis not present

## 2023-02-24 DIAGNOSIS — O318X11 Other complications specific to multiple gestation, first trimester, fetus 1: Secondary | ICD-10-CM | POA: Diagnosis not present

## 2023-02-24 DIAGNOSIS — O30001 Twin pregnancy, unspecified number of placenta and unspecified number of amniotic sacs, first trimester: Secondary | ICD-10-CM | POA: Insufficient documentation

## 2023-02-24 DIAGNOSIS — Z3A01 Less than 8 weeks gestation of pregnancy: Secondary | ICD-10-CM

## 2023-02-24 DIAGNOSIS — Z3A00 Weeks of gestation of pregnancy not specified: Secondary | ICD-10-CM | POA: Diagnosis not present

## 2023-02-24 DIAGNOSIS — O200 Threatened abortion: Secondary | ICD-10-CM | POA: Diagnosis not present

## 2023-02-24 DIAGNOSIS — O30009 Twin pregnancy, unspecified number of placenta and unspecified number of amniotic sacs, unspecified trimester: Secondary | ICD-10-CM | POA: Diagnosis not present

## 2023-02-24 LAB — URINALYSIS, ROUTINE W REFLEX MICROSCOPIC
Bilirubin Urine: NEGATIVE
Glucose, UA: NEGATIVE mg/dL
Ketones, ur: NEGATIVE mg/dL
Nitrite: NEGATIVE
Protein, ur: NEGATIVE mg/dL
RBC / HPF: >50 RBC/hpf (ref 0–5)
Specific Gravity, Urine: 1.026 (ref 1.005–1.030)
pH: 6 (ref 5.0–8.0)

## 2023-02-24 NOTE — MAU Provider Note (Signed)
Chief Complaint: Vaginal Bleeding   Event Date/Time   First Provider Initiated Contact with Patient 02/24/23 2110        SUBJECTIVE HPI: Yvonne [name] is a 32 y.o. G3P1011 with a twin gestation at [redacted]w[redacted]d by LMP who presents to maternity admissions reporting gush of blood and passage of a stringy piece of tissue.  Was told one of twins was probably not viable so wanted to be sure.. She denies vaginal itching/burning, urinary symptoms, h/a, dizziness, n/v, or fever/chills.     Vaginal Bleeding The patient's primary symptoms include pelvic pain and vaginal bleeding. This is a recurrent problem. The current episode started today. The pain is mild. She is pregnant. Associated symptoms include abdominal pain. Pertinent negatives include no chills, constipation or diarrhea. The vaginal discharge was bloody. The vaginal bleeding is lighter than menses. She has not been passing clots. She has been passing tissue. Nothing aggravates the symptoms. She has tried nothing for the symptoms.   RN Note: Yvonne [name] is a 32 y.o. at [redacted]w[redacted]d here in MAU reporting vag bleeding "and something long came out in my panties". This happened about 1900. Small amt bleeding since then. States has twins but once to be sure "both babies are still in there. One or two is ok". Some pain in L side that is dull.   Onset of complaint: 1900 Pain score: 1  Past Medical History:  Diagnosis Date   ADHD (attention deficit hyperactivity disorder)    off meds since 2011   Chlamydia 02/2012   treated at [facility]   Vitamin D deficiency    Past Surgical History:  Procedure Laterality Date   CESAREAN SECTION N/A 12/29/2016   Procedure: CESAREAN SECTION;  Surgeon: Huel Cote, MD;  Location: [facility] BIRTHING SUITES;  Service: Obstetrics;  Laterality: N/A;   DILATION AND EVACUATION Bilateral 09/30/2022   Procedure: DILATATION AND EVACUATION with GENETIC STUDIES (ANORA);  Surgeon: Lorriane Shire, MD;  Location: [facility];  Service: Gynecology;  Laterality: Bilateral;   OPERATIVE ULTRASOUND  09/30/2022   Procedure: OPERATIVE ULTRASOUND;  Surgeon: Lorriane Shire, MD;  Location: [facility];  Service: Gynecology;;   Social History   Socioeconomic History   Marital status: Single    Spouse name: Not on file   Number of children: Not on file   Years of education: Not on file   Highest education level: Not on file  Occupational History   Not on file  Tobacco Use   Smoking status: Never   Smokeless tobacco: Never  Vaping Use   Vaping status: Never Used  Substance and Sexual Activity   Alcohol use: Not Currently    Alcohol/week: 0.0 standard drinks of alcohol   Drug use: No   Sexual activity: Yes    Partners: Male  Other Topics Concern   Not on file  Social History Narrative   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  Lives with her mom, and her son (born 12/2016; the father isn't involved). Outside dog.   Brother lives in [place]   Social Determinants of Health   Financial Resource Strain: Not on file  Food Insecurity: Not on file  Transportation Needs: Not on file  Physical Activity: Not on file  Stress: Not on file  Social Connections: Unknown (03/04/2022)   [facility], [facility]   Social Network    Social Network: Not on file  Intimate Partner Violence: Unknown (03/04/2022)   [facility] from [facility], [facility]  HITS    Physically Hurt: Not on file    Insult or Talk Down To: Not on file    Threaten Physical Harm: Not on file    Scream or Curse: Not on file   No current facility-administered medications on file prior to encounter.   Current Outpatient Medications on File Prior to Encounter  Medication Sig Dispense Refill   Prenatal Vit-Fe Fumarate-FA (PRENATAL VITAMINS) 27-0.8 MG TABS Take 1 tablet by mouth daily.     No Known Allergies  I have reviewed patient's Past Medical Hx, Surgical Hx, Family Hx, Social Hx, medications and  allergies.   ROS:  Review of Systems  Constitutional:  Negative for chills.  Gastrointestinal:  Positive for abdominal pain. Negative for constipation and diarrhea.  Genitourinary:  Positive for pelvic pain and vaginal bleeding.   Review of Systems  Other systems negative   Physical Exam  Physical Exam Patient Vitals for the past 24 hrs:  BP Temp Pulse Resp SpO2 Height Weight  02/24/23 2104 120/71 -- -- -- -- -- --  02/24/23 2101 -- (!) 97.5 F (36.4 C) 90 17 100 % 5' (1.524 m) 70.3 kg   Constitutional: Well-developed, well-nourished female in no acute distress.  Cardiovascular: normal rate Respiratory: normal effort GI: Abd soft, non-tender.  MS: Extremities nontender, no edema, normal ROM Neurologic: Alert and oriented x 4.  GU: Neg CVAT.  PELVIC EXAM: deferred in lieu of Ultrasound,   LAB RESULTS No results found for this or any previous visit (from the past 24 hour(s)).  --/--/A POS (05/28 1222)  IMAGING US OB Transvaginal  Result Date: 02/25/2023 CLINICAL DATA:  Bleeding EXAM: TWIN OBSTETRICAL ULTRASOUND <14 WKS TECHNIQUE: Transabdominal ultrasound was performed for evaluation of the gestation as well as the maternal uterus and adnexal regions. COMPARISON:  Obstetrical ultrasound 02/22/2023 FINDINGS: Number of IUPs:  2 Chorionicity/Amnionicity:  Unclear TWIN 1 Yolk sac:  Not Visualized. Embryo:  Not Visualized. Cardiac Activity: Not Visualized. MSD: 6.5 mm   5 w   3 d This is decreased in size compared to prior study and has become more oblong in shape. TWIN 2 Yolk sac:  Not Visualized. Embryo:  Not Visualized. Cardiac Activity: Not Visualized. MSD: 4.4 mm   5 w   1 d This is decreased in size compared to prior study and has become more oblong in shape. Subchorionic hemorrhage:  None visualized. Maternal uterus/adnexae: Bilateral ovaries are visualized and appear within normal limits. There is no pelvic free fluid. IMPRESSION: 1. Two gestational sacs are again identified.  These have decreased in size compared to the prior study worrisome for failed IUP. Recommend correlation with beta HCG. Electronically Signed   By: Darliss Cheney M.D.   On: 02/25/2023 00:34     MAU Management/MDM: I have reviewed the triage vital signs and the nursing notes.   Pertinent labs & imaging results that were available during my care of the patient were reviewed by me and considered in my medical decision making (see chart for details).      I have reviewed her medical records including past results, notes and treatments. Medical, Surgical, and family history were reviewed.  Medications and recent lab tests were reviewed  Ordered Ultrasound to rule out passage of gestational sacs  This showed sacs present but smaller. Discussed they recomment followup US as scheduled to be sure but that prognosis is poor.   ASSESSMENT Twin intrauterine pregnancy at [redacted]w[redacted]d Bleeding Threatened abortion  PLAN Discharge home Will repeat  Ultrasound  in about 7-10 days SAB precautions  Pt stable at time of discharge. Encouraged to return here if she develops worsening of symptoms, increase in pain, fever, or other concerning symptoms.    Wynelle Bourgeois CNM, MSN Certified Nurse-Midwife 02/24/2023  9:10 PM

## 2023-02-24 NOTE — MAU Note (Signed)
Wynelle Bourgeois CNM in [facility] RM to see pt and discuss plan of care.

## 2023-02-24 NOTE — MAU Note (Signed)
.  Yvonne [name] is a 32 y.o. at [redacted]w[redacted]d here in MAU reporting vag bleeding "and something long came out in my panties". This happened about 1900. Small amt bleeding since then. States has twins but once to be sure "both babies are still in there. One or two is ok". Some pain in L side that is dull.   Onset of complaint: 1900 Pain score: 1 Vitals:   02/24/23 2101 02/24/23 2104  BP:  120/71  Pulse: 90   Resp: 17   Temp: (!) 97.5 F (36.4 C)   SpO2: 100%      FHT:n/a Lab orders placed from triage:  u/a

## 2023-02-25 ENCOUNTER — Ambulatory Visit: Payer: 59 | Admitting: Family Medicine

## 2023-02-25 ENCOUNTER — Other Ambulatory Visit: Payer: 59

## 2023-02-25 DIAGNOSIS — O200 Threatened abortion: Secondary | ICD-10-CM | POA: Diagnosis not present

## 2023-02-25 NOTE — Progress Notes (Signed)
Written and verbal d/c instructions given and understanding voiced. 

## 2023-03-04 ENCOUNTER — Encounter: Payer: 59 | Admitting: Obstetrics and Gynecology

## 2023-03-09 ENCOUNTER — Ambulatory Visit (INDEPENDENT_AMBULATORY_CARE_PROVIDER_SITE_OTHER): Payer: 59 | Admitting: Obstetrics and Gynecology

## 2023-03-09 ENCOUNTER — Other Ambulatory Visit: Payer: Self-pay | Admitting: Obstetrics and Gynecology

## 2023-03-09 ENCOUNTER — Ambulatory Visit (INDEPENDENT_AMBULATORY_CARE_PROVIDER_SITE_OTHER): Payer: 59

## 2023-03-09 ENCOUNTER — Encounter: Payer: Self-pay | Admitting: Obstetrics and Gynecology

## 2023-03-09 VITALS — BP 124/69 | HR 87 | Ht 61.0 in | Wt 153.5 lb

## 2023-03-09 DIAGNOSIS — Z3A08 8 weeks gestation of pregnancy: Secondary | ICD-10-CM

## 2023-03-09 DIAGNOSIS — O039 Complete or unspecified spontaneous abortion without complication: Secondary | ICD-10-CM | POA: Diagnosis not present

## 2023-03-09 DIAGNOSIS — Z3490 Encounter for supervision of normal pregnancy, unspecified, unspecified trimester: Secondary | ICD-10-CM

## 2023-03-09 DIAGNOSIS — O3680X0 Pregnancy with inconclusive fetal viability, not applicable or unspecified: Secondary | ICD-10-CM | POA: Diagnosis not present

## 2023-03-09 DIAGNOSIS — Z3A01 Less than 8 weeks gestation of pregnancy: Secondary | ICD-10-CM | POA: Diagnosis not present

## 2023-03-09 NOTE — Progress Notes (Signed)
  CC: SAB follow up Subjective:    Patient ID: Yvonne [name], female    DOB: 10-12-1990, 32 y.o.   MRN: [id number]  HPI 32 yo G3P1 seen for discussion of SAB.  Ultrasound today shows empty uterus.  Pt very emotional and upset today.  Once calmed down, discussed recurrent pregnancy loss labs and need for hcg today.  Review of Systems     Objective:   Physical Exam Vitals:   03/09/23 0842  BP: 124/69  Pulse: 87   U/s shows essentially empty uterus.  Formal read is pending.      Assessment & Plan:   1. SAB (spontaneous abortion) Will check bhcg today to make sure miscarriage is completed. Recurrent pregnancy loss labs also ordered for eval.  - Beta hCG quant (ref lab) - OB Complications Profile  Pt not ready for pap smear today. Will schedule annual exam/pap in 2 months.   Pt asks her pregnancy history not be discussed at next visit.  Warden Fillers, MD Faculty Attending, [facility] for [facility]

## 2023-03-10 LAB — BETA HCG QUANT (REF LAB): hCG Quant: 377 m[IU]/mL

## 2023-03-11 ENCOUNTER — Telehealth: Payer: Self-pay | Admitting: Lactation Services

## 2023-03-11 DIAGNOSIS — O039 Complete or unspecified spontaneous abortion without complication: Secondary | ICD-10-CM

## 2023-03-11 NOTE — Telephone Encounter (Signed)
Called patient to inform her of Beta Hcg and need for follow up. Beta Hcg scheduled at patients convenience.   Patient reports that she is experiencing some up and down emotions but feels she is handling well. She declined any need for follow up at this time. Patient informed to call if she is not feeling well and needs more assistance.

## 2023-03-11 NOTE — Telephone Encounter (Signed)
-----   Message from Warden Fillers sent at 03/10/2023 11:03 PM EST ----- Bhcg 377, large drop from previous, advise recheck of bhcg in 2 weeks

## 2023-03-24 ENCOUNTER — Other Ambulatory Visit: Payer: 59

## 2023-03-24 DIAGNOSIS — O039 Complete or unspecified spontaneous abortion without complication: Secondary | ICD-10-CM

## 2023-03-24 NOTE — Progress Notes (Signed)
Patient stopped at front desk during lab visit to report vaginal spotting. RN called pt to room to address concerns. Pt reports minimal spotting after vaginal intercourse. Reports bleeding stopped following SAB. Has not yet had period. Denies any vaginal irritation. Reviewed this could be bleeding from SAB or beginning of menstrual cycle. Encouraged pt to use barrier method such as condoms until HCG level is negative. Pt would like to try for pregnancy but was told by provider she should wait 3-4 months; encouraged pt to continue this plan.  Fleet Contras RN

## 2023-03-25 LAB — BETA HCG QUANT (REF LAB): hCG Quant: 32 m[IU]/mL

## 2023-03-31 ENCOUNTER — Telehealth: Payer: Self-pay

## 2023-03-31 DIAGNOSIS — O039 Complete or unspecified spontaneous abortion without complication: Secondary | ICD-10-CM

## 2023-03-31 NOTE — Telephone Encounter (Signed)
-----   Message from Warden Fillers sent at 03/31/2023  3:07 PM EST ----- Would repeat bhcg in 2-3 weeks, continued decrease noted

## 2023-03-31 NOTE — Telephone Encounter (Signed)
Called patient at number listed in chart--verified with full name and DOB. Informed patient of test results and provider recommendations. Patient scheduled for non-stat beta lab appointment on Monday, December 16th, at 0930. Patient had no other questions or concerns.   Maureen Ralphs RN on 03/31/23 at [phone]

## 2023-04-02 NOTE — BH Specialist Note (Signed)
Integrated Behavioral Health via Telemedicine Visit  04/16/2023 Yvonne [name] [id number]  Number of Integrated Behavioral Health Clinician visits: 1- Initial Visit  Session Start time: [id number]   Session End time: 1010  Total time in minutes: 54   Referring Provider: Mariel Aloe, MD Patient/Family location: Home [facility] Provider location: [facility] for [facility]  All persons participating in visit: Patient Yvonne [name] and Yvonne [name]   Types of Service: Individual psychotherapy and Video visit  I connected with Yvonne [name] and/or Yvonne [name]  n/a  via  Telephone or Video Enabled Telemedicine Application  (Video is Caregility application) and verified that I am speaking with the correct person using two identifiers. Discussed confidentiality: Yes   I discussed the limitations of telemedicine and the availability of in person appointments.  Discussed there is a possibility of technology failure and discussed alternative modes of communication if that failure occurs.  I discussed that engaging in this telemedicine visit, they consent to the provision of behavioral healthcare and the services will be billed under their insurance.  Patient and/or legal guardian expressed understanding and consented to Telemedicine visit: Yes   Presenting Concerns: Patient and/or family reports the following symptoms/concerns: Grieving loss of two pregnancies; most recent twin pregnancy.Pt often wants to "cry, scream and shout"; feels bad about her emotional outbursts at times, though she realizes this is part of the grieving process.   Patient and/or Family's Strengths/Protective Factors: Social connections and Sense of purpose  Goals Addressed: Patient will:    Demonstrate ability to: Begin healthy grieving over loss  Progress towards Goals: Ongoing  Interventions: Interventions utilized:  Supportive Counseling Standardized  Assessments completed: Not Needed  Patient and/or Family Response: Patient agrees with treatment plan.   Assessment: Patient currently experiencing Grief.   Patient may benefit from psychoeducation and brief therapeutic interventions regarding coping with symptoms of grief .  Plan: Follow up with behavioral health clinician on : One month Behavioral recommendations:  -Continue prioritizing healthy self-care (regular meals, adequate rest; allowing practical help from supportive friends and family)  -Consider Pregnancy loss support group as needed at either [email or web address].net or [email or web address]   Referral(s): Integrated Art gallery manager (In Clinic) and [facility]:  Pregnancy loss support  I discussed the assessment and treatment plan with the patient and/or parent/guardian. They were provided an opportunity to ask questions and all were answered. They agreed with the plan and demonstrated an understanding of the instructions.   They were advised to call back or seek an in-person evaluation if the symptoms worsen or if the condition fails to improve as anticipated.  Yvonne Lips, LCSW     10/09/2022    5:42 PM 03/16/2019    9:39 AM 03/10/2018    9:41 AM 01/14/2016    5:00 PM 06/28/2015    1:53 PM  Depression screen PHQ 2/9  Decreased Interest 0 0 0 0 0  Down, Depressed, Hopeless 1 0 0 0 0  PHQ - 2 Score 1 0 0 0 0  Altered sleeping 1      Tired, decreased energy 0      Change in appetite 0      Feeling bad or failure about yourself  1      Trouble concentrating 0      Moving slowly or fidgety/restless 0      Suicidal thoughts 0      PHQ-9 Score 3          10/09/2022  5:42 PM  GAD 7 : Generalized Anxiety Score  Nervous, Anxious, on Edge 0  Control/stop worrying 0  Worry too much - different things 0  Trouble relaxing 0  Restless 0  Easily annoyed or irritable 0  Afraid - awful might happen 0  Total GAD 7 Score 0

## 2023-04-13 ENCOUNTER — Other Ambulatory Visit: Payer: 59

## 2023-04-13 DIAGNOSIS — O039 Complete or unspecified spontaneous abortion without complication: Secondary | ICD-10-CM

## 2023-04-14 ENCOUNTER — Telehealth: Payer: Self-pay

## 2023-04-14 DIAGNOSIS — O021 Missed abortion: Secondary | ICD-10-CM

## 2023-04-14 DIAGNOSIS — O039 Complete or unspecified spontaneous abortion without complication: Secondary | ICD-10-CM

## 2023-04-14 LAB — BETA HCG QUANT (REF LAB): hCG Quant: 7 m[IU]/mL

## 2023-04-14 NOTE — Telephone Encounter (Addendum)
-----   Message from Warden Fillers sent at 04/14/2023  9:11 AM EST ----- Bhcg is 7, repeat in 2-3 weeks  Called pt; results reviewed. Lab appt scheduled for 05/04/23.

## 2023-04-16 ENCOUNTER — Ambulatory Visit (INDEPENDENT_AMBULATORY_CARE_PROVIDER_SITE_OTHER): Payer: 59 | Admitting: Clinical

## 2023-04-16 DIAGNOSIS — F4321 Adjustment disorder with depressed mood: Secondary | ICD-10-CM | POA: Diagnosis not present

## 2023-04-16 NOTE — Patient Instructions (Signed)
[facility] [address] , [place] [address] [phone] (main office) [phone] (Brianne Maina's office)  Pregnancy Loss Support Groups [email or web address] 

## 2023-04-27 NOTE — BH Specialist Note (Signed)
 Integrated Behavioral Health via Telemedicine Visit  05/11/2023 Yvonne [name] [id number]  Number of Integrated Behavioral Health Clinician visits: 2- Second Visit  Session Start time: 9077   Session End time: 0944  Total time in minutes: 22   Referring Provider: Jerilynn Buddle, MD Patient/Family location: [facility] Provider location: Center for [facility]  All persons participating in visit: Patient Yvonne [name], CNM and [facility] Baylynn Shifflett   Types of Service: Individual psychotherapy and Video visit  I connected with Yvonne [name] and/or Yvonne Sovereign  n/a  via  Telephone or Video Enabled Telemedicine Application  (Video is Caregility application) and verified that I am speaking with the correct person using two identifiers. Discussed confidentiality: Yes   I discussed the limitations of telemedicine and the availability of in person appointments.  Discussed there is a possibility of technology failure and discussed alternative modes of communication if that failure occurs.  I discussed that engaging in this telemedicine visit, they consent to the provision of behavioral healthcare and the services will be billed under their insurance.  Patient and/or legal guardian expressed understanding and consented to Telemedicine visit: Yes   Presenting Concerns: Patient and/or family reports the following symptoms/concerns: Grieving pregnancy losses; difficult when 6yo son asks about wanting siblings, as original due date comes closer, and previous holidays without babies.  Duration of problem: Ongoing post-losses  Patient and/or Family's Strengths/Protective Factors: Social connections, Concrete supports in place (healthy food, safe environments, etc.), Sense of purpose, and Physical Health (exercise, healthy diet, medication compliance, etc.)  Goals Addressed: Patient will:  Reduce symptoms of: anxiety and depression as related to  grief   Demonstrate ability to: Increase adequate support systems for patient/family and continue healthy grieving over losses  Progress towards Goals: Ongoing  Interventions: Interventions utilized:  Supportive Reflection Standardized Assessments completed: Not Needed  Patient and/or Family Response: Patient agrees with treatment plan.   Assessment: Patient currently experiencing Grief .   Patient may benefit from continued therapeutic intervention  .  Plan: Follow up with behavioral health clinician on : Call Anaeli Cornwall at [phone], as needed. Behavioral recommendations:  -Continue prioritizing healthy self-care daily (regular meals, adequate rest; allowing emotions to come to the surface; practical help from supportive friends and family) -Consider pregnancy loss support group as needed at either [email or web address].net or [email or web address].com  -Consider confiding in one trusted person at work (let this person know pregnancy due dates, so others will understand to be more supportive on those dates) Referral(s): Integrated [facility] (In Clinic) and [employer]:  pregnancy loss support  I discussed the assessment and treatment plan with the patient and/or parent/guardian. They were provided an opportunity to ask questions and all were answered. They agreed with the plan and demonstrated an understanding of the instructions.   They were advised to call back or seek an in-person evaluation if the symptoms worsen or if the condition fails to improve as anticipated.  Warren JAYSON Mering, LCSW     10/09/2022    5:42 PM 03/16/2019    9:39 AM 03/10/2018    9:41 AM 01/14/2016    5:00 PM 06/28/2015    1:53 PM  Depression screen PHQ 2/9  Decreased Interest 0 0 0 0 0  Down, Depressed, Hopeless 1 0 0 0 0  PHQ - 2 Score 1 0 0 0 0  Altered sleeping 1      Tired, decreased energy 0      Change in appetite 0  Feeling bad or failure about yourself  1      Trouble  concentrating 0      Moving slowly or fidgety/restless 0      Suicidal thoughts 0      PHQ-9 Score 3          10/09/2022    5:42 PM  GAD 7 : Generalized Anxiety Score  Nervous, Anxious, on Edge 0  Control/stop worrying 0  Worry too much - different things 0  Trouble relaxing 0  Restless 0  Easily annoyed or irritable 0  Afraid - awful might happen 0  Total GAD 7 Score 0

## 2023-05-04 ENCOUNTER — Other Ambulatory Visit: Payer: 59

## 2023-05-05 ENCOUNTER — Other Ambulatory Visit: Payer: 59

## 2023-05-05 DIAGNOSIS — O039 Complete or unspecified spontaneous abortion without complication: Secondary | ICD-10-CM

## 2023-05-06 LAB — BETA HCG QUANT (REF LAB): hCG Quant: 1 m[IU]/mL

## 2023-05-11 ENCOUNTER — Other Ambulatory Visit: Payer: Self-pay

## 2023-05-11 ENCOUNTER — Ambulatory Visit: Payer: 59 | Admitting: Clinical

## 2023-05-11 ENCOUNTER — Ambulatory Visit (INDEPENDENT_AMBULATORY_CARE_PROVIDER_SITE_OTHER): Payer: 59 | Admitting: Family Medicine

## 2023-05-11 ENCOUNTER — Other Ambulatory Visit (HOSPITAL_COMMUNITY)
Admission: RE | Admit: 2023-05-11 | Discharge: 2023-05-11 | Disposition: A | Discharge status: Home / Self Care | Payer: 59 | Source: Ambulatory Visit | Attending: Family Medicine | Admitting: Family Medicine

## 2023-05-11 ENCOUNTER — Encounter: Payer: Self-pay | Admitting: Family Medicine

## 2023-05-11 VITALS — BP 108/72 | HR 96 | Ht 60.0 in | Wt 148.0 lb

## 2023-05-11 DIAGNOSIS — Z124 Encounter for screening for malignant neoplasm of cervix: Secondary | ICD-10-CM

## 2023-05-11 DIAGNOSIS — Z113 Encounter for screening for infections with a predominantly sexual mode of transmission: Secondary | ICD-10-CM

## 2023-05-11 DIAGNOSIS — Z23 Encounter for immunization: Secondary | ICD-10-CM | POA: Diagnosis not present

## 2023-05-11 DIAGNOSIS — Z01419 Encounter for gynecological examination (general) (routine) without abnormal findings: Secondary | ICD-10-CM | POA: Diagnosis not present

## 2023-05-11 DIAGNOSIS — F4321 Adjustment disorder with depressed mood: Secondary | ICD-10-CM

## 2023-05-11 NOTE — Progress Notes (Signed)
 Subjective:     Yvonne [name] is a 33 y.o. female and is here for a comprehensive physical exam. The patient reports no problems.   The following portions of the patient's history were reviewed and updated as appropriate: allergies, current medications, past family history, past medical history, past social history, past surgical history, and problem list.  Review of Systems Pertinent items noted in HPI and remainder of comprehensive ROS otherwise negative.   Objective:    BP 108/72   Pulse 96   Ht 5' (1.524 m)   Wt 148 lb (67.1 kg)   LMP 04/18/2023   Breastfeeding Unknown   BMI 28.90 kg/m  General appearance: alert, cooperative, and appears stated age Head: Normocephalic, without obvious abnormality, atraumatic Neck: no adenopathy, supple, symmetrical, trachea midline, and thyroid  not enlarged, symmetric, no tenderness/mass/nodules Lungs: clear to auscultation bilaterally Breasts: normal appearance, no masses or tenderness Heart: regular rate and rhythm, S1, S2 normal, no murmur, click, rub or gallop Abdomen: soft, non-tender; bowel sounds normal; no masses,  no organomegaly Pelvic: cervix normal in appearance, external genitalia normal, no adnexal masses or tenderness, no cervical motion tenderness, uterus normal size, shape, and consistency, and vagina normal without discharge Extremities: extremities normal, atraumatic, no cyanosis or edema Pulses: 2+ and symmetric Skin: Skin color, texture, turgor normal. No rashes or lesions Lymph nodes: Cervical, supraclavicular, and axillary nodes normal. Neurologic: Grossly normal    Assessment:    Healthy female exam.      Plan:  Encounter for gynecological examination without abnormal finding  Pap smear for cervical cancer screening - Plan: Cytology - PAP( [place])  Screening examination for STD (sexually transmitted disease) - Plan: RPR+HBsAg+HCVAb+...  Return in 1 year (on 05/10/2024).        See After Visit  Summary for Counseling Recommendations

## 2023-05-11 NOTE — Patient Instructions (Signed)

## 2023-05-11 NOTE — Addendum Note (Signed)
 Addended by: Marjo Bicker on: 05/11/2023 05:54 PM   Modules accepted: Orders

## 2023-05-11 NOTE — Patient Instructions (Signed)
[facility] [address] , [place] [address] [phone] (main office) [phone] (Brianne Maina's office)  Pregnancy Loss Support Groups [email or web address] 

## 2023-05-12 LAB — CYTOLOGY - PAP
Chlamydia: NEGATIVE
Comment: NEGATIVE
Comment: NEGATIVE
Comment: NEGATIVE
Comment: NORMAL
Diagnosis: NEGATIVE
High risk HPV: NEGATIVE
Neisseria Gonorrhea: NEGATIVE
Trichomonas: POSITIVE — AB

## 2023-05-12 MED ORDER — METRONIDAZOLE 500 MG PO TABS: 500.0000 mg | ORAL_TABLET | Freq: Two times a day (BID) | ORAL | 0 refills | Status: AC

## 2023-05-12 NOTE — Addendum Note (Signed)
 Addended by: Reva Bores on: 05/12/2023 05:29 PM   Modules accepted: Orders

## 2023-05-13 LAB — RPR+HBSAG+HCVAB+...
HIV Screen 4th Generation wRfx: NONREACTIVE
Hep C Virus Ab: NONREACTIVE
Hepatitis B Surface Ag: NEGATIVE
RPR Ser Ql: NONREACTIVE

## 2023-12-23 ENCOUNTER — Ambulatory Visit

## 2024-03-24 ENCOUNTER — Other Ambulatory Visit: Payer: Self-pay

## 2024-03-24 ENCOUNTER — Encounter (HOSPITAL_COMMUNITY): Payer: Self-pay

## 2024-03-24 ENCOUNTER — Emergency Department (HOSPITAL_COMMUNITY)
Admission: EM | Admit: 2024-03-24 | Discharge: 2024-03-24 | Disposition: A | Discharge status: Home / Self Care | Attending: Emergency Medicine | Admitting: Emergency Medicine

## 2024-03-24 DIAGNOSIS — N898 Other specified noninflammatory disorders of vagina: Secondary | ICD-10-CM | POA: Diagnosis present

## 2024-03-24 LAB — URINALYSIS, ROUTINE W REFLEX MICROSCOPIC
Bacteria, UA: NONE SEEN
Bilirubin Urine: NEGATIVE
Glucose, UA: NEGATIVE mg/dL
Ketones, ur: NEGATIVE mg/dL
Leukocytes,Ua: NEGATIVE
Nitrite: NEGATIVE
Protein, ur: NEGATIVE mg/dL
Specific Gravity, Urine: 1.014 (ref 1.005–1.030)
pH: 6 (ref 5.0–8.0)

## 2024-03-24 LAB — HCG, SERUM, QUALITATIVE: Preg, Serum: NEGATIVE

## 2024-03-24 LAB — WET PREP, GENITAL
Clue Cells Wet Prep HPF POC: NONE SEEN
Sperm: NONE SEEN
Trich, Wet Prep: NONE SEEN
WBC, Wet Prep HPF POC: <10 (ref <10)
Yeast Wet Prep HPF POC: NONE SEEN

## 2024-03-24 LAB — HIV ANTIBODY (ROUTINE TESTING W REFLEX): HIV Screen 4th Generation wRfx: NONREACTIVE

## 2024-03-24 LAB — SYPHILIS: RPR W/REFLEX TO RPR TITER AND TREPONEMAL ANTIBODIES, TRADITIONAL SCREENING AND DIAGNOSIS ALGORITHM: RPR Ser Ql: NONREACTIVE

## 2024-03-24 NOTE — ED Notes (Signed)
 Phlebotomy at bedside.

## 2024-03-24 NOTE — ED Provider Notes (Signed)
  [place] EMERGENCY DEPARTMENT AT [facility] Provider Note   CSN: [id number] Arrival date & time: 03/24/24  1036     Patient presents with: Exposure to STD   Yvonne [name] is a 33 y.o. female.    Exposure to STD  Patient presents with potential STD/vaginal discharge.  For the last few days worried she could have bacterial vaginosis.  Also has had unprotected sex with a new partner.  States she has had a change in the odor.  No pain.  Does not think she is pregnant.    Past Medical History:  Diagnosis Date   ADHD (attention deficit hyperactivity disorder)    off meds since 2011   Chlamydia 02/2012   treated at [facility]   Vitamin D  deficiency     Prior to Admission medications   Medication Sig Start Date End Date Taking? Authorizing Provider  Prenatal Vit-Fe Fumarate-FA (PRENATAL VITAMINS) 27-0.8 MG TABS Take 1 tablet by mouth daily. Patient not taking: Reported on 05/11/2023    [provider]    Allergies: Patient has no known allergies.    Review of Systems  Updated Vital Signs BP 116/72   Pulse 90   Temp 98.5 F (36.9 C) (Oral)   Resp 16   Ht 5' (1.524 m)   Wt 68 kg   LMP 03/17/2024 (Approximate)   SpO2 100%   Breastfeeding No   BMI 29.29 kg/m   Physical Exam Vitals and nursing note reviewed.  Cardiovascular:     Rate and Rhythm: Normal rate.  Abdominal:     Tenderness: There is no abdominal tenderness.  Musculoskeletal:        General: No tenderness.  Neurological:     Mental Status: She is alert and oriented to person, place, and time.     (all labs ordered are listed, but only abnormal results are displayed) Labs Reviewed  URINALYSIS, ROUTINE W REFLEX MICROSCOPIC - Abnormal; Notable for the following components:      Result Value   Hgb urine dipstick SMALL (*)    All other components within normal limits  WET PREP, GENITAL  SYPHILIS: RPR W/REFLEX TO RPR TITER AND TREPONEMAL ANTIBODIES, TRADITIONAL SCREENING  AND DIAGNOSIS ALGORITHM  HIV ANTIBODY (ROUTINE TESTING W REFLEX)  HCG, SERUM, QUALITATIVE  GC/CHLAMYDIA PROBE AMP ([place]) NOT AT [facility]    EKG: None  Radiology: No results found.   Procedures   Medications Ordered in the ED - No data to display                                  Medical Decision Making Amount and/or Complexity of Data Reviewed Labs: ordered.   Patient with vaginal discharge.  Differential diagnosis does include STDs but also bacterial vaginosis etc.  Pelvic exam done and showed what appeared to be abnormal discharge.  Wet prep pending.  Wet prep does not show infection.  Discharge home.     Final diagnoses:  Vaginal odor    ED Discharge Orders     None          Patsey Lot, MD 03/25/24 0700

## 2024-03-24 NOTE — ED Triage Notes (Addendum)
 Pt would like to get checked for STDs and BV. C/O clear white drainage/ odors/ itchiness in vaginal area for a few days. Denies urinary issues/ abd pain. Pt states new sexual partner a couple of days ago with no protection. Axox4.

## 2024-03-24 NOTE — Discharge Instructions (Signed)
 Does not look like there is a vaginal infection.  Follow-up with your doctors as needed.

## 2024-03-25 LAB — GC/CHLAMYDIA PROBE AMP (~~LOC~~) NOT AT ARMC
Chlamydia: NEGATIVE
Comment: NEGATIVE
Comment: NORMAL
Neisseria Gonorrhea: NEGATIVE
